# Patient Record
Sex: Male | Born: 1965
Health system: Southern US, Community
[De-identification: ages and names within clinical notes are randomized; demographics above are authoritative.]

## PROBLEM LIST (undated history)

## (undated) DIAGNOSIS — K649 Unspecified hemorrhoids: Secondary | ICD-10-CM

## (undated) DIAGNOSIS — G43909 Migraine, unspecified, not intractable, without status migrainosus: Secondary | ICD-10-CM

## (undated) DIAGNOSIS — K219 Gastro-esophageal reflux disease without esophagitis: Secondary | ICD-10-CM

## (undated) HISTORY — DX: Unspecified hemorrhoids: K64.9

## (undated) HISTORY — DX: Gastro-esophageal reflux disease without esophagitis: K21.9

## (undated) HISTORY — DX: Migraine, unspecified, not intractable, without status migrainosus: G43.909

---

## 1988-10-13 HISTORY — PX: INGUINAL HERNIA REPAIR: SUR1180

## 2013-10-20 ENCOUNTER — Ambulatory Visit: Payer: Self-pay | Admitting: Family Medicine

## 2013-10-31 ENCOUNTER — Ambulatory Visit (INDEPENDENT_AMBULATORY_CARE_PROVIDER_SITE_OTHER): Payer: 59 | Admitting: Family Medicine

## 2013-10-31 ENCOUNTER — Ambulatory Visit (INDEPENDENT_AMBULATORY_CARE_PROVIDER_SITE_OTHER): Payer: Private Health Insurance - Indemnity | Admitting: General Surgery

## 2013-10-31 ENCOUNTER — Encounter: Payer: Self-pay | Admitting: Family Medicine

## 2013-10-31 ENCOUNTER — Encounter (INDEPENDENT_AMBULATORY_CARE_PROVIDER_SITE_OTHER): Payer: Self-pay | Admitting: General Surgery

## 2013-10-31 VITALS — BP 110/78 | HR 80 | Temp 98.3°F | Ht 72.0 in | Wt 241.0 lb

## 2013-10-31 VITALS — BP 126/86 | HR 66 | Temp 97.4°F | Resp 16 | Ht 72.0 in | Wt 240.2 lb

## 2013-10-31 DIAGNOSIS — B356 Tinea cruris: Secondary | ICD-10-CM | POA: Insufficient documentation

## 2013-10-31 DIAGNOSIS — E669 Obesity, unspecified: Secondary | ICD-10-CM | POA: Insufficient documentation

## 2013-10-31 DIAGNOSIS — K649 Unspecified hemorrhoids: Secondary | ICD-10-CM

## 2013-10-31 DIAGNOSIS — K219 Gastro-esophageal reflux disease without esophagitis: Secondary | ICD-10-CM | POA: Insufficient documentation

## 2013-10-31 DIAGNOSIS — E663 Overweight: Secondary | ICD-10-CM | POA: Insufficient documentation

## 2013-10-31 DIAGNOSIS — G43909 Migraine, unspecified, not intractable, without status migrainosus: Secondary | ICD-10-CM | POA: Insufficient documentation

## 2013-10-31 LAB — COMPREHENSIVE METABOLIC PANEL
ALT: 26 U/L (ref 0–53)
AST: 13 U/L (ref 0–37)
Albumin: 4.2 g/dL (ref 3.5–5.2)
Alkaline Phosphatase: 50 U/L (ref 39–117)
BUN: 13 mg/dL (ref 6–23)
CO2: 27 mEq/L (ref 19–32)
Calcium: 9 mg/dL (ref 8.4–10.5)
Chloride: 104 mEq/L (ref 96–112)
Creatinine, Ser: 1.5 mg/dL (ref 0.4–1.5)
GFR: 54.36 mL/min — ABNORMAL LOW (ref >60.00)
Glucose, Bld: 90 mg/dL (ref 70–99)
Potassium: 4.2 mEq/L (ref 3.5–5.1)
Sodium: 138 mEq/L (ref 135–145)
Total Bilirubin: 0.9 mg/dL (ref 0.3–1.2)
Total Protein: 7.4 g/dL (ref 6.0–8.3)

## 2013-10-31 LAB — LIPID PANEL
Cholesterol: 190 mg/dL (ref 0–200)
HDL: 46.7 mg/dL (ref >39.00)
LDL Cholesterol: 125 mg/dL — ABNORMAL HIGH (ref 0–99)
Total CHOL/HDL Ratio: 4
Triglycerides: 92 mg/dL (ref 0.0–149.0)
VLDL: 18.4 mg/dL (ref 0.0–40.0)

## 2013-10-31 LAB — TSH: TSH: 3.52 u[IU]/mL (ref 0.35–5.50)

## 2013-10-31 MED ORDER — CYCLOBENZAPRINE HCL 10 MG PO TABS: 10.0000 mg | ORAL_TABLET | Freq: Two times a day (BID) | ORAL | Status: DC | PRN

## 2013-10-31 MED ORDER — HYDROCORTISONE ACETATE 25 MG RE SUPP
25.0000 mg | Freq: Two times a day (BID) | RECTAL | Status: DC | PRN
Start: 2013-10-31 — End: 2014-03-31

## 2013-10-31 NOTE — Assessment & Plan Note (Signed)
Chronic issue with hemorrhoids.  Evidence of both int/ext hemorrhoids on exam today at least grade II.   Causing some bowel leakage. Some inflammation on exam today - treat with rectacort suppositories. Will refer to surgery for futher evaluation/treatment.  Pt hesitant for surgery but agrees with plan for definitive management.

## 2013-10-31 NOTE — Progress Notes (Signed)
Subjective:    Patient ID: Eric Colon, male    DOB: Feb 02, 1966, 48 y.o.   MRN: [id number]  HPI CC: new pt to establish  Eric Colon presents today to establish care.  Fungal rash - self treated with lamisil and OTC creams.  Has seen dermatologist Dr. Koleen Nimrod for this - treated with oral lamisil which helped, but 1 mo after finishing treatment rash returns.  Has been using antifungal paste as well.  H/o hemorrhoids for 1-2 years - worse recently.  Increasing bleeding with stools.  Bleeding in stool and with wiping.  Clots at times as well.  Recently noticing bowel leaking as well.  No h/o colon evaluation in the past.  No weight loss noted.  1 stool a day, no frank constipation or diarrhea. Body mass index is 32.68 kg/(m^2).  GERD - prilosec has helped in the past.  Currently controlled with rolaids.  H/o migraines - last was 1 wk ago.  Tends to get every 2-3 weeks.  Normal headaches improve with caffeine.  Migraine - waits it out for several hours.  Typically unilateral severe pain.  + nausea, photo/phonophobia, activity interrupting headaches.  Notices lower extremity stiffness over last year.    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  Past histories reviewed and updated if relevant as below. There are no active problems to display for this patient.  Past Medical History  Diagnosis Date  . GERD (gastroesophageal reflux disease)   . Migraines   . Hemorrhoids    Past Surgical History  Procedure Laterality Date  . Inguinal hernia repair Left 1990   History  Substance Use Topics  . Smoking status: Never Smoker   . Smokeless tobacco: Never Used  . Alcohol Use: Yes     Comment: 2 beers/week   Family  History  Problem Relation Age of Onset  . Cancer Mother 42    lung (smoker)  . Cancer Father 1    prostate?  . CAD Neg Hx   . Stroke Neg Hx   . Diabetes Maternal Grandmother    No Known Allergies No current outpatient prescriptions on file prior to visit.   No current facility-administered medications on file prior to visit.     Review of Systems Per HPI    Objective:   Physical Exam  Nursing note and vitals reviewed. Constitutional: He is oriented to person, place, and time. He appears well-developed and well-nourished. No distress.  HENT:  Head: Normocephalic and atraumatic.  Nose: Nose normal.  Mouth/Throat: No oropharyngeal exudate.  Eyes: Conjunctivae and EOM are normal. Pupils are equal, round, and reactive to light. No scleral icterus.  Neck: Normal range of motion. Neck supple.  Cardiovascular: Normal rate, regular rhythm, normal heart sounds and intact distal pulses.   No murmur heard. Pulses:      Radial pulses are 2+ on the right side, and 2+ on the left side.  Pulmonary/Chest: Effort normal and breath sounds normal. No respiratory distress. He has no wheezes. He has no rales.  Abdominal: Soft. Bowel sounds are normal. He exhibits no distension and no mass. There is no tenderness. There is no rebound and no guarding.  Genitourinary: Rectal exam shows external hemorrhoid and internal hemorrhoid.  Several hemorrhoids int/ext present  Musculoskeletal:  Normal range of motion. He exhibits no edema.  Lymphadenopathy:    He has no cervical adenopathy.  Neurological: He is alert and oriented to person, place, and time.  CN grossly intact, station and gait intact  Skin: Skin is warm and dry. Rash noted.  Spreading pruritic slightly scaly annular erythematous rash on lower abd, groin and buttock.  Spares scrotum.  Psychiatric: He has a normal mood and affect. His behavior is normal. Judgment and thought content normal.       Assessment & Plan:

## 2013-10-31 NOTE — Patient Instructions (Addendum)
Pick up prescription at [facility]. Irregular bowel habits such as constipation and diarrhea can lead to many problems over time.  Having one soft bowel movement a day is the most important way to prevent further problems.  The anorectal canal is designed to handle stretching and feces to safely manage our ability to get rid of solid waste (feces, poop, stool) out of our body.  BUT, hard constipated stools can act like ripping concrete bricks and diarrhea can be a burning fire to this very sensitive area of our body, causing inflamed hemorrhoids, anal fissures, increasing risk is perirectal abscesses, abdominal pain/bloating, an making irritable bowel worse.     The goal: ONE SOFT BOWEL MOVEMENT A DAY!  To have soft, regular bowel movements:    Drink at least 8 tall glasses of water a day.     Take plenty of fiber.  Fiber is the undigested part of plant food that passes into the colon, acting s "natures broom" to encourage bowel motility and movement.  Fiber can absorb and hold large amounts of water. This results in a larger, bulkier stool, which is soft and easier to pass. Work gradually over several weeks up to 6 servings a day of fiber (25g a day even more if needed) in the form of: o Vegetables -- Root (potatoes, carrots, turnips), leafy green (lettuce, salad greens, celery, spinach), or cooked high residue (cabbage, broccoli, etc) o Fruit -- Fresh (unpeeled skin & pulp), Dried (prunes, apricots, cherries, etc ),  or stewed ( applesauce)  o Whole grain breads, pasta, etc (whole wheat)  o Bran cereals    Bulking Agents -- This type of water-retaining fiber generally is easily obtained each day by one of the following:  o Psyllium bran -- The psyllium plant is remarkable because its ground seeds can retain so much water. This product is available as Metamucil, Konsyl, Effersyllium, Per Diem Fiber, or the less expensive generic preparation in drug and health food stores.  Although labeled a laxative, it really is not a laxative.  o Methylcellulose -- This is another fiber derived from wood which also retains water. It is available as Citrucel. o Benefiber o Polyethylene Glycol - and "artificial" fiber commonly called Miralax or Glycolax.  It is helpful for people with gassy or bloated feelings with regular fiber o Flax Seed - a less gassy fiber than psyllium   No reading or other relaxing activity while on the toilet. If bowel movements take longer than 5 minutes, you are too constipated   AVOID CONSTIPATION.  High fiber and water intake usually takes care of this.  Sometimes a laxative is needed to stimulate more frequent bowel movements, but    Laxatives are not a good long-term solution as it can wear the colon out. o Osmotics (Milk of Magnesia, Fleets phosphosoda, Magnesium citrate, MiraLax, GoLytely) are safer than  o Stimulants (Senokot, Castor Oil, Dulcolax, Ex Lax)    o Do not take laxatives for more than 7days in a row.    IF SEVERELY CONSTIPATED, try a Bowel Retraining Program: o Do not use laxatives.  o Eat a diet high in roughage, such as bran cereals and leafy vegetables.  o Drink six (6) ounces of prune or apricot juice each morning.  o Eat two (2) large servings of stewed fruit each day.  o Take one (1) heaping tablespoon of a psyllium-based bulking agent twice a day. Use sugar-free sweetener when possible to avoid excessive calories.  o Eat a normal breakfast.  o Set aside 15 minutes after breakfast to sit on the toilet, but do not strain to have a bowel movement.  o If you do not have a bowel movement by the third day, use an enema and repeat the above steps.    Controlling diarrhea o Switch to liquids and simpler foods for a few days to avoid stressing your intestines further. o Avoid dairy products (especially milk & ice cream) for a short time.  The intestines often can lose the ability to digest lactose when stressed. o Avoid foods that  cause gassiness or bloating.  Typical foods include beans and other legumes, cabbage, broccoli, and dairy foods.  Every person has some sensitivity to other foods, so listen to our body and avoid those foods that trigger problems for you. o Adding fiber (Citrucel, Metamucil, psyllium, Miralax) gradually can help thicken stools by absorbing excess fluid and retrain the intestines to act more normally.  Slowly increase the dose over a few weeks.  Too much fiber too soon can backfire and cause cramping & bloating. o Probiotics (such as active yogurt, Align, etc) may help repopulate the intestines and colon with normal bacteria and calm down a sensitive digestive tract.  Most studies show it to be of mild help, though, and such products can be costly. o Medicines:   Bismuth subsalicylate (ex. Kayopectate, Pepto Bismol) every 30 minutes for up to 6 doses can help control diarrhea.  Avoid if pregnant.   Loperamide (Immodium) can slow down diarrhea.  Start with two tablets (4mg  total) first and then try one tablet every 6 hours.  Avoid if you are having fevers or severe pain.  If you are not better or start feeling worse, stop all medicines and call your doctor for advice o Call your doctor if you are getting worse or not better.  Sometimes further testing (cultures, endoscopy, X-ray studies, bloodwork, etc) may be needed to help diagnose and treat the cause of the diarrhea. o

## 2013-10-31 NOTE — Assessment & Plan Note (Signed)
Check LFTs, if normal, will treat with oral antifungal (griseofulvin tid 250mg  x14 days).   Will also recommend drying powder after treatment, avoiding tight fitting undergarments, and avoid hot baths. Check glu today to eval for diabetes in recurrent tinea infections. Will need to eval for athlete's foot next visit.

## 2013-10-31 NOTE — Progress Notes (Signed)
Patient ID: Eric Colon, male   DOB: [birth year], 48 y.o.   MRN: [id number]  Chief Complaint  Patient presents with  . Hemorrhoids    urgent- eval hems, bleeding    HPI Eric Colon is a 48 y.o. male.   HPI 48 year old Caucasian male referred by Dr. Danise Mina for evaluation of hemorrhoidal problems. The patient states that he's had hemorrhoidal problems for a long time. His main complaint over the past 8-9 months has been leakage after a bowel movement. He describes the drainage as clear and sometimes yellowish and it has been ongoing for 8-9 months. He reports having stool in his underwear on occasion. He states that he always has lots of gas from his bottom. He has a bowel movement at least every other day but mostly daily. He sits on the commode for 10-20 minutes at a time. He drinks 3-4 bottles of water a day. Sometimes it does hurt when he has a bowel movement. He also has been having bright red blood in the commode, and in his stool. He denies any weight loss. He denies any family history colorectal cancer. He denies any weight loss. He denies any pain with urination. He states that sometimes he feels like he doesn't completely empty his rectum. He has been suffering with a rash on his lower abdominal wall and around his bottom. It has cleared up with antifungal ointment in the past. Past Medical History  Diagnosis Date  . GERD (gastroesophageal reflux disease)   . Migraines   . Hemorrhoids     Past Surgical History  Procedure Laterality Date  . Inguinal hernia repair Right 1990    Family History  Problem Relation Age of Onset  . Cancer Mother 24    lung (smoker)  . Cancer Father 99    prostate?  . CAD Neg Hx   . Stroke Neg Hx   . Diabetes Maternal Grandmother     Social History History  Substance Use Topics  . Smoking status: Never Smoker   . Smokeless tobacco: Never Used  . Alcohol Use: Yes     Comment: 2 beers/week    No Known Allergies  Current Outpatient  Prescriptions  Medication Sig Dispense Refill  . cyclobenzaprine (FLEXERIL) 10 MG tablet Take 1 tablet (10 mg total) by mouth 2 (two) times daily as needed (migraines (sedation precautions)).  30 tablet  0  . hydrocortisone (RECTACORT-HC) 25 MG suppository Place 1 suppository (25 mg total) rectally 2 (two) times daily as needed for hemorrhoids or itching.  12 suppository  0   No current facility-administered medications for this visit.    Review of Systems Review of Systems  Constitutional: Negative for fever, chills, appetite change and unexpected weight change.  HENT: Negative for congestion and trouble swallowing.   Eyes: Negative for visual disturbance.  Respiratory: Negative for chest tightness and shortness of breath.   Cardiovascular: Negative for chest pain and leg swelling.       No PND, no orthopnea, no DOE  Gastrointestinal:       See HPI  Genitourinary: Negative for dysuria and hematuria.  Musculoskeletal: Negative.   Skin: Negative for rash.  Neurological: Negative for seizures and speech difficulty.  Hematological: Does not bruise/bleed easily.  Psychiatric/Behavioral: Negative for behavioral problems and confusion.    Blood pressure 126/86, pulse 66, temperature 97.4 F (36.3 C), temperature source Temporal, resp. rate 16, height 6' (1.829 m), weight 240 lb 3.2 oz (108.954 kg).  Physical Exam Physical Exam  Vitals reviewed. Constitutional: He is oriented to person, place, and time. He appears well-developed and well-nourished. No distress.  HENT:  Head: Normocephalic and atraumatic.  Right Ear: External ear normal.  Left Ear: External ear normal.  Eyes: Conjunctivae are normal. No scleral icterus.  Neck: Normal range of motion. Neck supple. No tracheal deviation present. No thyromegaly present.  Cardiovascular: Normal rate, normal heart sounds and intact distal pulses.   Pulmonary/Chest: Effort normal and breath sounds normal. No respiratory distress. He has no  wheezes.  Abdominal: Soft. He exhibits no distension. There is no tenderness. There is no rebound.    Genitourinary: Rectal exam shows internal hemorrhoid and anal tone abnormal. Rectal exam shows no fissure.     Scattered rash on l buttock; tone seems decreased; has redundant nonthrombosed circumferential hemorrhoidal tissue, +3 column large int hemorrhoids on anoscopy - mostly grade 2; left lateral may be grade 3 - which also has raw surface. No necrosis.   Musculoskeletal: Normal range of motion. He exhibits no edema and no tenderness.  Lymphadenopathy:    He has no cervical adenopathy.  Neurological: He is alert and oriented to person, place, and time. He exhibits normal muscle tone.  Skin: Skin is warm and dry. No rash noted. He is not diaphoretic. No erythema. No pallor.  Psychiatric: He has a normal mood and affect. His behavior is normal. Judgment and thought content normal.    Data Reviewed PCP note  Assessment    Circumferential non thrombosed external hemorrhoids 3 column internal hemorrhoids (left lateral - grade 2 vs 3)     Plan    We discussed the etiology of hemorrhoids. The patient was given educational material as well as diagrams. We discussed nonoperative and operative management of hemorrhoidal disease.  We discussed the importance of having a daily soft bowel movement and avoiding constipation. We also discussed good bowel habits such as not reading in the bathroom, not straining, and drinking 6-8 glasses of water per day. We also discussed the importance of a high fiber diet. We discussed foods that were high in fiber as well as fiber supplements. We discussed the importance of trying to get 25-30 g of fiber per day in their diet. We discussed the need to start with a low dose of fiber and then gradually increasing their daily fiber dose over several weeks in order to avoid bloating and cramping.   PLAN: His hemorrhoids are certainly impressive especially the  left lateral 1. However none of them require urgent surgical intervention. I explained that even with surgery he will still need to correct and improve his bowel habits. Therefore we are going to start with nonoperative management as described above. Because of his questionable decreased anal tone how have asked him to see our colorectal surgeon Dr. Marcello Moores for followup and exam. Even with correction of his underlying bowel habits I think he will ultimately need hemorrhoidectomy as I doubt the left lateral one will significantly improve even with nonoperative management. I am concerned about his questionable anal tone. He may also need an early screening colonoscopy. But given the irritation of the left lateral hemorrhoid it can certainly account for his bleeding. He will be made an appointment to see Dr. Marcello Moores in February.   Leighton Ruff. Redmond Pulling, MD, FACS General, Bariatric, & Minimally Invasive Surgery [facility], [place]          [facility] M 10/31/2013, 4:19 PM

## 2013-10-31 NOTE — Assessment & Plan Note (Signed)
Check FLP, CMP and TSH today. Discussed healthy diet changes and importance of regular exercise for sustainable weight loss.   Recommended against bariatric medication.

## 2013-10-31 NOTE — Progress Notes (Signed)
Pre-visit discussion using our clinic review tool. No additional management support is needed unless otherwise documented below in the visit note.  

## 2013-10-31 NOTE — Assessment & Plan Note (Signed)
1/2wks.  With treat abortively with flexeril prn.

## 2013-10-31 NOTE — Patient Instructions (Signed)
Use steroid suppositories for hemorrhoids.  I do recommend surgery referral for this - pass by Marion's office for this. For migraines - may use flexeril muscle relaxant at onset of headache. For rash - will await blood work then call you with oral antifungal.  This rash seems consistent with ringworm of groin - tinea cruris. Blood work today. Return in 1-2 months for follow up

## 2013-10-31 NOTE — Assessment & Plan Note (Signed)
Significant rolaid use - recommended back off this and may use OTC PPI - nexium OTC samples provided today.

## 2013-11-01 ENCOUNTER — Other Ambulatory Visit: Payer: Self-pay | Admitting: Family Medicine

## 2013-11-01 MED ORDER — GRISEOFULVIN ULTRAMICROSIZE 250 MG PO TABS: 250.0000 mg | ORAL_TABLET | Freq: Three times a day (TID) | ORAL | Status: DC

## 2013-11-02 ENCOUNTER — Telehealth: Payer: Self-pay

## 2013-11-02 MED ORDER — TERBINAFINE HCL 250 MG PO TABS: 250.0000 mg | ORAL_TABLET | Freq: Every day | ORAL | Status: DC

## 2013-11-02 NOTE — Telephone Encounter (Signed)
plz notify lamisil was sent in.

## 2013-11-02 NOTE — Telephone Encounter (Signed)
[facility] left v/m that griseofulvin was expensive and pt asked pharmacy to cb and request Lamisil; pt thought Lamisil was being sent to pharmacy.Please advise.

## 2013-11-02 NOTE — Telephone Encounter (Signed)
Patient notified

## 2013-11-24 ENCOUNTER — Encounter (INDEPENDENT_AMBULATORY_CARE_PROVIDER_SITE_OTHER): Payer: Private Health Insurance - Indemnity | Admitting: General Surgery

## 2013-11-29 ENCOUNTER — Encounter (INDEPENDENT_AMBULATORY_CARE_PROVIDER_SITE_OTHER): Payer: Private Health Insurance - Indemnity | Admitting: General Surgery

## 2014-01-06 ENCOUNTER — Ambulatory Visit (INDEPENDENT_AMBULATORY_CARE_PROVIDER_SITE_OTHER): Payer: 59 | Admitting: Family Medicine

## 2014-01-06 ENCOUNTER — Encounter: Payer: Self-pay | Admitting: Family Medicine

## 2014-01-06 VITALS — BP 110/80 | HR 68 | Temp 98.0°F | Wt 235.0 lb

## 2014-01-06 DIAGNOSIS — R5381 Other malaise: Secondary | ICD-10-CM

## 2014-01-06 DIAGNOSIS — N289 Disorder of kidney and ureter, unspecified: Secondary | ICD-10-CM

## 2014-01-06 DIAGNOSIS — J309 Allergic rhinitis, unspecified: Secondary | ICD-10-CM

## 2014-01-06 DIAGNOSIS — R5383 Other fatigue: Secondary | ICD-10-CM

## 2014-01-06 DIAGNOSIS — K649 Unspecified hemorrhoids: Secondary | ICD-10-CM

## 2014-01-06 DIAGNOSIS — G43909 Migraine, unspecified, not intractable, without status migrainosus: Secondary | ICD-10-CM

## 2014-01-06 DIAGNOSIS — J302 Other seasonal allergic rhinitis: Secondary | ICD-10-CM

## 2014-01-06 DIAGNOSIS — B356 Tinea cruris: Secondary | ICD-10-CM

## 2014-01-06 MED ORDER — TERBINAFINE HCL 250 MG PO TABS: 250.0000 mg | ORAL_TABLET | Freq: Every day | ORAL | Status: DC

## 2014-01-06 MED ORDER — CLOTRIMAZOLE 1 % EX CREA: 1.0000 "application " | TOPICAL_CREAM | Freq: Two times a day (BID) | CUTANEOUS | Status: DC

## 2014-01-06 MED ORDER — GRISEOFULVIN MICROSIZE 500 MG PO TABS: 500.0000 mg | ORAL_TABLET | Freq: Every day | ORAL | Status: DC

## 2014-01-06 NOTE — Progress Notes (Signed)
BP 110/80  Pulse 68  Temp(Src) 98 F (36.7 C) (Oral)  Wt 235 lb (106.595 kg)   CC: 2 mo f/u  Subjective:    Patient ID: Eric Colon, male    DOB: 1965/11/16, 48 y.o.   MRN: [id number]  HPI: Eric Colon is a 48 y.o. male presenting on 01/06/2014 for Follow-up   See prior note for details.  Migraines - flexeril abortively working well.  Overall stable.  Notices that increased water intake has led to decreased migraines.  CKD - Cr 1.5.  Has made conscious effort to increase water intake.  Hemorrhoids - seen by Dr. Redmond Pulling, rec see Dr. Marcello Moores colorectal surgeon. Did not f/u with him yet (cancellations due to snow).  rec increase in fiber.  Tinea cruris - griseofulvin 2 wk course after normal LFTs.  Rash returns once he stops lamisil.  Has not been regular with [employer].  Also noticed rash returned after prolonged trip to pool.  Increasing fatigue recently - over last few weeks since he decreased energy drink intake.    Seasonal allergic rhinitis - treating with claritin D OTC.  Red watery eyes especially at work.    Wt Readings from Last 3 Encounters:  01/06/14 235 lb (106.595 kg)  10/31/13 240 lb 3.2 oz (108.954 kg)  10/31/13 241 lb (109.317 kg)    Relevant past medical, surgical, family and social history reviewed and updated as indicated.  Allergies and medications reviewed and updated. Current Outpatient Prescriptions on File Prior to Visit  Medication Sig  . cyclobenzaprine (FLEXERIL) 10 MG tablet Take 1 tablet (10 mg total) by mouth 2 (two) times daily as needed (migraines (sedation precautions)).  . hydrocortisone (RECTACORT-HC) 25 MG suppository Place 1 suppository (25 mg total) rectally 2 (two) times daily as needed for hemorrhoids or itching.   No current facility-administered medications on file prior to visit.    Review of Systems Per HPI unless specifically indicated above    Objective:    BP 110/80  Pulse 68  Temp(Src) 98 F (36.7 C) (Oral)  Wt  235 lb (106.595 kg)  Physical Exam  Nursing note and vitals reviewed. Constitutional: He appears well-developed and well-nourished. No distress.  HENT:  Head: Normocephalic and atraumatic.  Right Ear: Hearing, tympanic membrane, external ear and ear canal normal.  Left Ear: Hearing, tympanic membrane, external ear and ear canal normal.  Nose: Mucosal edema and rhinorrhea present. Right sinus exhibits no maxillary sinus tenderness and no frontal sinus tenderness. Left sinus exhibits no maxillary sinus tenderness and no frontal sinus tenderness.  Mouth/Throat: Uvula is midline, oropharynx is clear and moist and mucous membranes are normal. No oropharyngeal exudate, posterior oropharyngeal edema, posterior oropharyngeal erythema or tonsillar abscesses.  Boggy turbinates  Eyes: EOM are normal. Pupils are equal, round, and reactive to light. Right conjunctiva is injected. Left conjunctiva is injected. No scleral icterus.  Neck: Normal range of motion. Neck supple.  Cardiovascular: Normal rate, regular rhythm, normal heart sounds and intact distal pulses.   No murmur heard. Pulmonary/Chest: Effort normal and breath sounds normal. No respiratory distress. He has no wheezes. He has no rales.  Musculoskeletal: He exhibits no edema.  R foot clear L foot clear with great toenail with onychomycosis  Lymphadenopathy:    He has no cervical adenopathy.  Skin: Rash noted.  Persistent pruritic papular rash right lower abdomen and groin with central clearing   Results for orders placed in visit on 01/06/14  HEPATIC FUNCTION PANEL  Result Value Ref Range   Total Bilirubin 0.5  0.2 - 1.2 mg/dL   Bilirubin, Direct 0.1  0.0 - 0.3 mg/dL   Indirect Bilirubin 0.4  0.2 - 1.2 mg/dL   Alkaline Phosphatase 57  39 - 117 U/L   AST 14  0 - 37 U/L   ALT 24  0 - 53 U/L   Total Protein 6.6  6.0 - 8.3 g/dL   Albumin 4.3  3.5 - 5.2 g/dL  VITAMIN B12      Result Value Ref Range   Vitamin B-12 529  211 - 911 pg/mL   BASIC METABOLIC PANEL      Result Value Ref Range   Sodium 137  135 - 145 mEq/L   Potassium 4.3  3.5 - 5.3 mEq/L   Chloride 101  96 - 112 mEq/L   CO2 24  19 - 32 mEq/L   Glucose, Bld 78  70 - 99 mg/dL   BUN 20  6 - 23 mg/dL   Creat 1.36 (*) 0.50 - 1.35 mg/dL   Calcium 9.3  8.4 - 10.5 mg/dL      Assessment & Plan:   Problem List Items Addressed This Visit   Fatigue     Anticipate due to recently significantly decreased use of energy drinks. Will check B12 per pt request as he's interested in B12 shots.    Relevant Orders      Hepatic function panel (Completed)      Vitamin B12 (Completed)      Basic metabolic panel (Completed)   Hemorrhoids     Significant external hemorrhoids along with decreased rectal tone.  Planning on rescheduling f/u with colorectal surgeon for further evaluation.  Increase in fiber has helped stool consistency but increased cramping and gas - discussed retrial of soluble fiber supplement.    Migraines     Improved with flexeril prn and increased water intake, decreased energy drinks.  Continue.    Renal insufficiency     New dx - rpt Cr today.  Pt endorses increased water intake.    Relevant Orders      Hepatic function panel (Completed)      Vitamin B12 (Completed)      Basic metabolic panel (Completed)   Seasonal allergic rhinitis     Continue claritin D.  Consider antihistamine allergy drops.    Tinea cruris - Primary     While on lamisil, rash disappears but quickly returns once he discontinues med.  Aware planned pool/beach trips will likely exacerbate condition. Will do another course of treatment - discussed use of oral antifungal and use of topical antifungal (clotrimazole sent in) and then use of drying powder regularly. Prior could not afford griseofulvin so we used lamisil.  Provided scripts for both for pt to price out - has always been on lamisil, never trial of other antifungal.  Aware griseofulvin may provide better effect. Will  recheck LFTs in anticipation of another oral antifungal course. No evidence of athlete's foot.    Relevant Medications      terbinafine (LAMISIL) 250 MG tablet      clotrimazole (LOTRIMIN) 1 % cream      griseofulvin (GRIFULVIN V) 500 MG tablet   Other Relevant Orders      Hepatic function panel (Completed)      Vitamin B12 (Completed)      Basic metabolic panel (Completed)       Follow up plan: Return as needed, for follow up.

## 2014-01-06 NOTE — Patient Instructions (Signed)
I've sent in lamisil and printed out prescription for griseofulvin 500mg  daily.  Price both out then take one for another month. Restart drying powder and may use topical antifungal (Sent to pharmacy). Liver function checked today. Look into more soluble fiber supplements.  Jock Itch Jock itch is a fungal infection of the skin in the groin area. It is sometimes called "ringworm" even though it is not caused by a worm. A fungus is a type of germ that thrives in dark, damp places.  CAUSES  This infection may spread from:  A fungus infection elsewhere on the body (such as athlete's foot).  Sharing towels or clothing. This infection is more common in:  Hot, humid climates.  People who wear tight-fitting clothing or wet bathing suits for long periods of time.  Athletes.  Overweight people.  People with diabetes. SYMPTOMS  Jock itch causes the following symptoms:  Red, pink or brown rash in the groin. Rash may spread to the thighs, anus, and buttocks.  Itching. DIAGNOSIS  Your caregiver may make the diagnosis by looking at the rash. Sometimes a skin scraping will be sent to test for fungus. Testing can be done either by looking under the microscope or by doing a culture (test to try to grow the fungus). A culture can take up to 2 weeks to come back. TREATMENT  Jock itch may be treated with:  Skin cream or ointment to kill fungus.  Medicine by mouth to kill fungus.  Skin cream or ointment to calm the itching.  Compresses or medicated powders to dry the infected skin. HOME CARE INSTRUCTIONS   Be sure to treat the rash completely. Follow your caregiver's instructions. It can take a couple of weeks to treat. If you do not treat the infection long enough, the rash can come back.  Wear loose-fitting clothing.  Men should wear cotton boxer shorts.  Women should wear cotton underwear.  Avoid hot baths.  Dry the groin area well after bathing. SEEK MEDICAL CARE IF:   Your  rash is worse.  Your rash is spreading.  Your rash returns after treatment is finished.  Your rash is not gone in 4 weeks. Fungal infections are slow to respond to treatment. Some redness may remain for several weeks after the fungus is gone. SEEK IMMEDIATE MEDICAL CARE IF:  The area becomes red, warm, tender, and swollen.  You have a fever. Document Released: 09/19/2002 Document Revised: 12/22/2011 Document Reviewed: 08/18/2008 [facility] Patient Information 2014 [place], [place].

## 2014-01-06 NOTE — Progress Notes (Signed)
Pre visit review using our clinic review tool, if applicable. No additional management support is needed unless otherwise documented below in the visit note. 

## 2014-01-07 DIAGNOSIS — J302 Other seasonal allergic rhinitis: Secondary | ICD-10-CM | POA: Insufficient documentation

## 2014-01-07 DIAGNOSIS — N289 Disorder of kidney and ureter, unspecified: Secondary | ICD-10-CM | POA: Insufficient documentation

## 2014-01-07 DIAGNOSIS — R5383 Other fatigue: Secondary | ICD-10-CM | POA: Insufficient documentation

## 2014-01-07 LAB — BASIC METABOLIC PANEL
BUN: 20 mg/dL (ref 6–23)
CO2: 24 mEq/L (ref 19–32)
Calcium: 9.3 mg/dL (ref 8.4–10.5)
Chloride: 101 mEq/L (ref 96–112)
Creat: 1.36 mg/dL — ABNORMAL HIGH (ref 0.50–1.35)
Glucose, Bld: 78 mg/dL (ref 70–99)
Potassium: 4.3 mEq/L (ref 3.5–5.3)
Sodium: 137 mEq/L (ref 135–145)

## 2014-01-07 LAB — HEPATIC FUNCTION PANEL
ALT: 24 U/L (ref 0–53)
AST: 14 U/L (ref 0–37)
Albumin: 4.3 g/dL (ref 3.5–5.2)
Alkaline Phosphatase: 57 U/L (ref 39–117)
Bilirubin, Direct: 0.1 mg/dL (ref 0.0–0.3)
Indirect Bilirubin: 0.4 mg/dL (ref 0.2–1.2)
Total Bilirubin: 0.5 mg/dL (ref 0.2–1.2)
Total Protein: 6.6 g/dL (ref 6.0–8.3)

## 2014-01-07 LAB — VITAMIN B12: Vitamin B-12: 529 pg/mL (ref 211–911)

## 2014-01-07 NOTE — Assessment & Plan Note (Signed)
Continue claritin D.  Consider antihistamine allergy drops.

## 2014-01-07 NOTE — Assessment & Plan Note (Signed)
Significant external hemorrhoids along with decreased rectal tone.  Planning on rescheduling f/u with colorectal surgeon for further evaluation.  Increase in fiber has helped stool consistency but increased cramping and gas - discussed retrial of soluble fiber supplement.

## 2014-01-07 NOTE — Assessment & Plan Note (Signed)
While on lamisil, rash disappears but quickly returns once he discontinues med.  Aware planned pool/beach trips will likely exacerbate condition. Will do another course of treatment - discussed use of oral antifungal and use of topical antifungal (clotrimazole sent in) and then use of drying powder regularly. Prior could not afford griseofulvin so we used lamisil.  Provided scripts for both for pt to price out - has always been on lamisil, never trial of other antifungal.  Aware griseofulvin may provide better effect. Will recheck LFTs in anticipation of another oral antifungal course. No evidence of athlete's foot.

## 2014-01-07 NOTE — Assessment & Plan Note (Signed)
Improved with flexeril prn and increased water intake, decreased energy drinks.  Continue.

## 2014-01-07 NOTE — Assessment & Plan Note (Signed)
New dx - rpt Cr today.  Pt endorses increased water intake.

## 2014-01-07 NOTE — Assessment & Plan Note (Signed)
Anticipate due to recently significantly decreased use of energy drinks. Will check B12 per pt request as he's interested in B12 shots.

## 2014-03-31 ENCOUNTER — Encounter: Payer: Self-pay | Admitting: Family Medicine

## 2014-03-31 ENCOUNTER — Ambulatory Visit (INDEPENDENT_AMBULATORY_CARE_PROVIDER_SITE_OTHER): Payer: 59 | Admitting: Family Medicine

## 2014-03-31 VITALS — BP 128/88 | HR 96 | Temp 98.2°F | Wt 225.5 lb

## 2014-03-31 DIAGNOSIS — B356 Tinea cruris: Secondary | ICD-10-CM

## 2014-03-31 MED ORDER — TERBINAFINE HCL 250 MG PO TABS: 250.0000 mg | ORAL_TABLET | Freq: Every day | ORAL | Status: DC

## 2014-03-31 NOTE — Patient Instructions (Signed)
Call Dr. Darnell Level next week and give him an update.  I'll talk to him about potentially staggered dosing and the potential need for follow up liver testing.  Take care.

## 2014-03-31 NOTE — Progress Notes (Signed)
Pre visit review using our clinic review tool, if applicable. No additional management support is needed unless otherwise documented below in the visit note.  H/o tinea cruris.  He had a course of lamisil prev and improved.  Was doing well until recently.  B groin itchy rash. D/w pt about possible options.    Meds, vitals, and allergies reviewed.   ROS: See HPI.  Otherwise, noncontributory.  nad B tinea infection in the groin, on the lower abd and the buttocks.

## 2014-04-02 NOTE — Assessment & Plan Note (Signed)
Start 10 day course of lamisil.  Will defer to PCP about pulse dosing, etc.  I didn't check LFTs at this point as this is a short course.  He agrees.  Will ask for PCP input.

## 2014-11-30 ENCOUNTER — Other Ambulatory Visit: Payer: Self-pay | Admitting: Family Medicine

## 2014-11-30 NOTE — Telephone Encounter (Signed)
Ok to refill 

## 2015-02-26 ENCOUNTER — Other Ambulatory Visit: Payer: Self-pay

## 2015-02-26 ENCOUNTER — Telehealth: Payer: Self-pay | Admitting: Emergency Medicine

## 2015-02-26 ENCOUNTER — Emergency Department
Admission: EM | Admit: 2015-02-26 | Discharge: 2015-02-26 | Disposition: A | Discharge status: Home / Self Care | Payer: Managed Care, Other (non HMO) | Attending: Emergency Medicine | Admitting: Emergency Medicine

## 2015-02-26 ENCOUNTER — Encounter: Payer: Self-pay | Admitting: Emergency Medicine

## 2015-02-26 ENCOUNTER — Telehealth: Payer: Self-pay | Admitting: Family Medicine

## 2015-02-26 ENCOUNTER — Emergency Department: Payer: Managed Care, Other (non HMO)

## 2015-02-26 DIAGNOSIS — F419 Anxiety disorder, unspecified: Secondary | ICD-10-CM | POA: Insufficient documentation

## 2015-02-26 DIAGNOSIS — R0602 Shortness of breath: Secondary | ICD-10-CM | POA: Diagnosis present

## 2015-02-26 DIAGNOSIS — Z79899 Other long term (current) drug therapy: Secondary | ICD-10-CM | POA: Insufficient documentation

## 2015-02-26 DIAGNOSIS — K219 Gastro-esophageal reflux disease without esophagitis: Secondary | ICD-10-CM | POA: Insufficient documentation

## 2015-02-26 LAB — CBC
HCT: 47.7 % (ref 40.0–52.0)
Hemoglobin: 16.2 g/dL (ref 13.0–18.0)
MCH: 29.2 pg (ref 26.0–34.0)
MCHC: 33.9 g/dL (ref 32.0–36.0)
MCV: 86.2 fL (ref 80.0–100.0)
Platelets: 163 10*3/uL (ref 150–440)
RBC: 5.53 MIL/uL (ref 4.40–5.90)
RDW: 13.1 % (ref 11.5–14.5)
WBC: 3.7 10*3/uL — ABNORMAL LOW (ref 3.8–10.6)

## 2015-02-26 LAB — BASIC METABOLIC PANEL
Anion gap: 6 (ref 5–15)
BUN: 23 mg/dL — ABNORMAL HIGH (ref 6–20)
CO2: 27 mmol/L (ref 22–32)
Calcium: 9.1 mg/dL (ref 8.9–10.3)
Chloride: 105 mmol/L (ref 101–111)
Creatinine, Ser: 1.37 mg/dL — ABNORMAL HIGH (ref 0.61–1.24)
GFR calc Af Amer: >60 mL/min (ref >60)
GFR calc non Af Amer: >60 mL/min (ref >60)
Glucose, Bld: 108 mg/dL — ABNORMAL HIGH (ref 65–99)
Potassium: 4.8 mmol/L (ref 3.5–5.1)
Sodium: 138 mmol/L (ref 135–145)

## 2015-02-26 LAB — TROPONIN I: Troponin I: <0.03 ng/mL (ref <0.031)

## 2015-02-26 MED ORDER — SUCRALFATE 1 G PO TABS: 1.0000 g | ORAL_TABLET | Freq: Four times a day (QID) | ORAL | Status: DC

## 2015-02-26 MED ORDER — RANITIDINE HCL 150 MG PO CAPS: 150.0000 mg | ORAL_CAPSULE | Freq: Two times a day (BID) | ORAL | Status: DC

## 2015-02-26 NOTE — ED Notes (Signed)
Pt left before discharge instructions, MD aware

## 2015-02-26 NOTE — Telephone Encounter (Signed)
[facility]  Patient Name: Eric Colon  DOB: 05/19/1966    Initial Comment Caller states, starting Thurs night, he has cold sweats, shortness of breath, and pain on lower left side    Nurse Assessment  Nurse: Mechele Dawley, RN, Amy Date/Time (Eastern Time): 02/26/2015 9:42:15 AM  Confirm and document reason for call. If symptomatic, describe symptoms. ---CALLER STATES THAT HE HAS BEEN HAVING PAIN ON HIS LEFT LOWER SIDE. HE IS HAVING SORENESS IN ALL OF HIS JOINTS. HE IS HAVING SORENESS ON THE LEFT SIDE OF HIS BODY. HE STATES THAT HE FEELS AS IF HE HAS HE FLU AS IN SOB, BODYACHES. HE STATES THAT HE IS HAVING COLD SWEATS, SOB AND NO ENERGY. HE WAS HAVING CHEST PAIN ON SAT MORNING. HE STATES THAT IT LASTED 30 MINUTES INTERMITTENTLY. HE STATES THAT IT WAS AS IF HE PULLED A MUSCLE IN HIS CHEST.  Has the patient traveled out of the country within the last 30 days? ---Not Applicable  Does the patient require triage? ---Yes  Related visit to physician within the last 2 weeks? ---No  Does the PT have any chronic conditions? (i.e. diabetes, asthma, etc.) ---No     Guidelines    Guideline Title Affirmed Question Affirmed Notes  Chest Pain [1] Intermittent chest pain or "angina" AND [2] increasing in severity or frequency (Exception: pains lasting a few seconds)    Final Disposition User   Go to ED Now [place], Therapist, sports, Amy

## 2015-02-26 NOTE — ED Provider Notes (Signed)
[facility] Emergency Department Provider Note  ____________________________________________  Time seen: 12:20 PM  I have reviewed the triage vital signs and the nursing notes.   HISTORY  Chief Complaint Shortness of Breath    HPI Eric Colon is a 49 y.o. male report shortness of breath mainly at night. It's worse lying supine. He does report a history of GERD but has been off his Nexium for several months now. He denies any chest pain, dizziness or syncope. No nausea, vomiting. She he also has been having some night sweats. Has had recent travel in the last year or 22, both as well as [place]. Has a dry cough but nothing productive. No hemoptysis. No known exposure to people with tuberculosis. No known immunosuppression. No fever     Past Medical History  Diagnosis Date  . GERD (gastroesophageal reflux disease)   . Migraines   . Hemorrhoids     Patient Active Problem List   Diagnosis Date Noted  . Renal insufficiency 01/07/2014  . Fatigue 01/07/2014  . Seasonal allergic rhinitis 01/07/2014  . Obesity 10/31/2013  . Tinea cruris 10/31/2013  . GERD (gastroesophageal reflux disease)   . Migraines   . Hemorrhoids     Past Surgical History  Procedure Laterality Date  . Inguinal hernia repair Right 1990    Current Outpatient Rx  Name  Route  Sig  Dispense  Refill  . clotrimazole (LOTRIMIN) 1 % cream   Topical   Apply 1 application topically 2 (two) times daily.   60 g   0   . cyclobenzaprine (FLEXERIL) 10 MG tablet   Oral   Take 1 tablet (10 mg total) by mouth 2 (two) times daily as needed (migraines (sedation precautions)).   30 tablet   0   . ranitidine (ZANTAC) 150 MG capsule   Oral   Take 1 capsule (150 mg total) by mouth 2 (two) times daily.   28 capsule   0   . sucralfate (CARAFATE) 1 G tablet   Oral   Take 1 tablet (1 g total) by mouth 4 (four) times daily.   120 tablet   1   . terbinafine (LAMISIL) 250 MG tablet     TAKE ONE TABLET BY MOUTH ONCE DAILY   10 tablet   0     May repeat x1, but if needed again will need OV an ...     Allergies Review of patient's allergies indicates no known allergies.  Family History  Problem Relation Age of Onset  . Cancer Mother 17    lung (smoker)  . Cancer Father 90    prostate?  . CAD Neg Hx   . Stroke Neg Hx   . Diabetes Maternal Grandmother     Social History History  Substance Use Topics  . Smoking status: Never Smoker   . Smokeless tobacco: Never Used  . Alcohol Use: Yes     Comment: 2 beers/week    Review of Systems  Constitutional: Night sweats and chills. No weight changes Eyes:No blurry vision or double vision.  ENT: No sore throat. Cardiovascular: No chest pain. Respiratory: Nonproductive cough, dyspnea. Gastrointestinal: Negative for abdominal pain, vomiting and diarrhea.  No BRBPR or melena. Genitourinary: Negative for dysuria, urinary retention, bloody urine, or difficulty urinating. Musculoskeletal: Negative for back pain. No joint swelling or pain. Skin: Negative for rash. Neurological: Negative for headaches, focal weakness or numbness. Psychiatric: Anxiety. No SI or HI or hallucinations  Endocrine:No hot/cold intolerance, changes in energy,  or sleep difficulty.  10-point ROS otherwise negative.  ____________________________________________   PHYSICAL EXAM:  VITAL SIGNS: ED Triage Vitals  Enc Vitals Group     BP 02/26/15 1006 121/96 mmHg     Pulse Rate 02/26/15 1006 99     Resp 02/26/15 1006 20     Temp 02/26/15 1006 97.8 F (36.6 C)     Temp Source 02/26/15 1006 Oral     SpO2 02/26/15 1006 99 %     Weight 02/26/15 1006 234 lb (106.142 kg)     Height 02/26/15 1006 6' (1.829 m)     Head Cir --      Peak Flow --      Pain Score 02/26/15 1007 2     Pain Loc --      Pain Edu? --      Excl. in [place]? --      Constitutional: Alert and oriented. Well appearing and in no distress. Eyes: No scleral icterus. No  conjunctival pallor. PERRL. EOMI ENT   Head: Normocephalic and atraumatic.   Nose: No congestion/rhinnorhea. No septal hematoma   Mouth/Throat: MMM, positive pharyngeal erythema. No peritonsillar mass. No uvula shift.   Neck: No stridor. No SubQ emphysema. No meningismus. Hematological/Lymphatic/Immunilogical: No cervical lymphadenopathy. Cardiovascular: RRR. Normal and symmetric distal pulses are present in all extremities. No murmurs, rubs, or gallops. Respiratory: Normal respiratory effort without tachypnea nor retractions. Breath sounds are clear and equal bilaterally. No wheezes/rales/rhonchi. Gastrointestinal: Soft and nontender. No distention. There is no CVA tenderness.  No rebound, rigidity, or guarding. Genitourinary: deferred Musculoskeletal: Nontender with normal range of motion in all extremities. No joint effusions.  No lower extremity tenderness.  No edema. Neurologic:   Normal speech and language.  CN 2-10 normal. Motor grossly intact. No pronator drift.  Normal gait. No gross focal neurologic deficits are appreciated.  Skin:  Skin is warm, dry and intact. No rash noted.  No petechiae, purpura, or bullae. Psychiatric: Mood and affect are normal. Speech and behavior are normal. Patient exhibits appropriate insight and judgment. Appears slightly anxious  ____________________________________________    LABS (pertinent positives/negatives) (all labs ordered are listed, but only abnormal results are displayed) Labs Reviewed  CBC - Abnormal; Notable for the following:    WBC 3.7 (*)    All other components within normal limits  BASIC METABOLIC PANEL - Abnormal; Notable for the following:    Glucose, Bld 108 (*)    BUN 23 (*)    Creatinine, Ser 1.37 (*)    All other components within normal limits  TROPONIN I   ____________________________________________   EKG  Normal sinus rhythm, rate of 87. Normal axis, normal intervals. poor R-wave progression in  anterior leads. This is with T-wave inversion in lead 3. No evidence of right heart strain or an S1 Q3 T3 pattern  ____________________________________________    RADIOLOGY  Chest x-ray unremarkable  ____________________________________________   PROCEDURES  ____________________________________________   INITIAL IMPRESSION / ASSESSMENT AND PLAN / ED COURSE  Pertinent labs & imaging results that were available during my care of the patient were reviewed by me and considered in my medical decision making (see chart for details).  Patient is in no acute distress, nontoxic, well-appearing. We'll check a chest x-ray to evaluate for TB, although I think the suspicion is low. He can follow up with his primary care doctor for PPD testing, most likely symptoms related to his GERD which she has stopped treating with Nexium. He does report that it is  better when he takes Tums. If workup is unremarkable, we'll discharge her home with prescriptions for Zantac and Carafate. Follow up with his doctor for further management  ----------------------------------------- 1:43 PM on 02/26/2015 ----------------------------------------- Workup unremarkable. Patient stated he did not feel like waiting for results anymore. Patient eloped from the ED before discharge paperwork could be given to him, but I did have an extensive discussion with him on initial evaluation about the likely diagnosis of GERD and the need to manage this with resuming his Nexium or taking ranitidine and Carafate. I did encourage him to follow up with his primary care doctor for continued treatment. Patient is not in distress, has no apparent emergency condition at this time such as ACS, TAD, PE, pneumothorax, carditis, or sepsis, and has medical decision-making capacity.     ____________________________________________   FINAL CLINICAL IMPRESSION(S) / ED DIAGNOSES  Final diagnoses:  Gastroesophageal reflux disease, esophagitis  presence not specified  Anxiety      Carrie Mew, MD 02/26/15 1344

## 2015-02-26 NOTE — Telephone Encounter (Signed)
plz call for update - looks like he was seen at [facility] ER.

## 2015-02-26 NOTE — Discharge Instructions (Signed)
Gastroesophageal Reflux Disease, Adult  Gastroesophageal reflux disease (GERD) happens when acid from your stomach flows up into the esophagus. When acid comes in contact with the esophagus, the acid causes soreness (inflammation) in the esophagus. Over time, GERD may create small holes (ulcers) in the lining of the esophagus.  CAUSES   · Increased body weight. This puts pressure on the stomach, making acid rise from the stomach into the esophagus.  · Smoking. This increases acid production in the stomach.  · Drinking alcohol. This causes decreased pressure in the lower esophageal sphincter (valve or ring of muscle between the esophagus and stomach), allowing acid from the stomach into the esophagus.  · Late evening meals and a full stomach. This increases pressure and acid production in the stomach.  · A malformed lower esophageal sphincter.  Sometimes, no cause is found.  SYMPTOMS   · Burning pain in the lower part of the mid-chest behind the breastbone and in the mid-stomach area. This may occur twice a week or more often.  · Trouble swallowing.  · Sore throat.  · Dry cough.  · Asthma-like symptoms including chest tightness, shortness of breath, or wheezing.  DIAGNOSIS   Your caregiver may be able to diagnose GERD based on your symptoms. In some cases, X-rays and other tests may be done to check for complications or to check the condition of your stomach and esophagus.  TREATMENT   Your caregiver may recommend over-the-counter or prescription medicines to help decrease acid production. Ask your caregiver before starting or adding any new medicines.   HOME CARE INSTRUCTIONS   · Change the factors that you can control. Ask your caregiver for guidance concerning weight loss, quitting smoking, and alcohol consumption.  · Avoid foods and drinks that make your symptoms worse, such as:  ¨ Caffeine or alcoholic drinks.  ¨ Chocolate.  ¨ Peppermint or mint flavorings.  ¨ Garlic and onions.  ¨ Spicy foods.  ¨ Citrus fruits,  such as oranges, lemons, or limes.  ¨ Tomato-based foods such as sauce, chili, salsa, and pizza.  ¨ Fried and fatty foods.  · Avoid lying down for the 3 hours prior to your bedtime or prior to taking a nap.  · Eat small, frequent meals instead of large meals.  · Wear loose-fitting clothing. Do not wear anything tight around your waist that causes pressure on your stomach.  · Raise the head of your bed 6 to 8 inches with wood blocks to help you sleep. Extra pillows will not help.  · Only take over-the-counter or prescription medicines for pain, discomfort, or fever as directed by your caregiver.  · Do not take aspirin, ibuprofen, or other nonsteroidal anti-inflammatory drugs (NSAIDs).  SEEK IMMEDIATE MEDICAL CARE IF:   · You have pain in your arms, neck, jaw, teeth, or back.  · Your pain increases or changes in intensity or duration.  · You develop nausea, vomiting, or sweating (diaphoresis).  · You develop shortness of breath, or you faint.  · Your vomit is green, yellow, black, or looks like coffee grounds or blood.  · Your stool is red, bloody, or black.  These symptoms could be signs of other problems, such as heart disease, gastric bleeding, or esophageal bleeding.  MAKE SURE YOU:   · Understand these instructions.  · Will watch your condition.  · Will get help right away if you are not doing well or get worse.  Document Released: 07/09/2005 Document Revised: 12/22/2011 Document Reviewed: 04/18/2011  ExitCare® Patient   Information ©2015 [employer]. This information is not intended to replace advice given to you by your health care provider. Make sure you discuss any questions you have with your health care provider.  Food Choices for Gastroesophageal Reflux Disease  When you have gastroesophageal reflux disease (GERD), the foods you eat and your eating habits are very important. Choosing the right foods can help ease the discomfort of GERD.  WHAT GENERAL GUIDELINES DO I NEED TO FOLLOW?  · Choose fruits,  vegetables, whole grains, low-fat dairy products, and low-fat meat, fish, and poultry.  · Limit fats such as oils, salad dressings, butter, nuts, and avocado.  · Keep a food diary to identify foods that cause symptoms.  · Avoid foods that cause reflux. These may be different for different people.  · Eat frequent small meals instead of three large meals each day.  · Eat your meals slowly, in a relaxed setting.  · Limit fried foods.  · Cook foods using methods other than frying.  · Avoid drinking alcohol.  · Avoid drinking large amounts of liquids with your meals.  · Avoid bending over or lying down until 2-3 hours after eating.  WHAT FOODS ARE NOT RECOMMENDED?  The following are some foods and drinks that may worsen your symptoms:  Vegetables  Tomatoes. Tomato juice. Tomato and spaghetti sauce. Chili peppers. Onion and garlic. Horseradish.  Fruits  Oranges, grapefruit, and lemon (fruit and juice).  Meats  High-fat meats, fish, and poultry. This includes hot dogs, ribs, ham, sausage, salami, and bacon.  Dairy  Whole milk and chocolate milk. Sour cream. Cream. Butter. Ice cream. Cream cheese.   Beverages  Coffee and tea, with or without caffeine. Carbonated beverages or energy drinks.  Condiments  Hot sauce. Barbecue sauce.   Sweets/Desserts  Chocolate and cocoa. Donuts. Peppermint and spearmint.  Fats and Oils  High-fat foods, including French fries and potato chips.  Other  Vinegar. Strong spices, such as black pepper, white pepper, red pepper, cayenne, curry powder, cloves, ginger, and chili powder.  The items listed above may not be a complete list of foods and beverages to avoid. Contact your dietitian for more information.  Document Released: 09/29/2005 Document Revised: 10/04/2013 Document Reviewed: 08/03/2013  ExitCare® Patient Information ©2015 [employer]. This information is not intended to replace advice given to you by your health care provider. Make sure you discuss any questions you have with your  health care provider.

## 2015-02-26 NOTE — ED Notes (Signed)
Pt appears anxious, pt reports shortness of breath at times, pt reports increased stress at work, pt reports night sweats, pt states" I think this could be stress , my MD told me to come to the ER to get checked out"

## 2015-02-26 NOTE — ED Notes (Signed)
Patient approached this RN stating, "Cut this thing off my wrist. If this is what our healthcare system does, I'm leaving. I'll go home and die." This RN discussed situation and delays with patient; encouraged patient to remain to be seen by EDP. Patient reports increased stress in his life, states "There's nothing you can do about it." Camera operator, Marya Amsler, notified.

## 2015-02-26 NOTE — ED Notes (Signed)
Pt to ed with c/o shortness of breath since Thursday pm.  Pt states "it is not severe, but I feel like I can't take a deep breath".  Pt reports joint aching, fever, mild cough.

## 2015-02-27 NOTE — Telephone Encounter (Signed)
Left message on cell phone to call back

## 2015-02-28 NOTE — Telephone Encounter (Signed)
Spoke with patient and he is just really stressed. He has follow up scheduled for 03/02/15.

## 2015-03-02 ENCOUNTER — Ambulatory Visit (INDEPENDENT_AMBULATORY_CARE_PROVIDER_SITE_OTHER): Payer: Managed Care, Other (non HMO) | Admitting: Family Medicine

## 2015-03-02 ENCOUNTER — Encounter: Payer: Self-pay | Admitting: Family Medicine

## 2015-03-02 VITALS — BP 120/74 | HR 75 | Temp 97.8°F | Wt 237.5 lb

## 2015-03-02 DIAGNOSIS — F411 Generalized anxiety disorder: Secondary | ICD-10-CM | POA: Diagnosis not present

## 2015-03-02 DIAGNOSIS — K219 Gastro-esophageal reflux disease without esophagitis: Secondary | ICD-10-CM | POA: Diagnosis not present

## 2015-03-02 MED ORDER — PANTOPRAZOLE SODIUM 40 MG PO TBEC: 40.0000 mg | DELAYED_RELEASE_TABLET | Freq: Every day | ORAL | Status: DC

## 2015-03-02 MED ORDER — CITALOPRAM HYDROBROMIDE 10 MG PO TABS: 10.0000 mg | ORAL_TABLET | Freq: Every day | ORAL | Status: DC

## 2015-03-02 NOTE — Assessment & Plan Note (Signed)
rec start protonix 40mg  daily for next 3-4 wks then prn. Update with effect. Failed rolaids, tums, zantac Nexium OTC effective but pt didn't like daily medication.

## 2015-03-02 NOTE — Patient Instructions (Addendum)
For heartburn - let's start protonix (pantoprazole) 40mg  daily for next 3-4 weeks then as needed. For anxiety - questionairre today. Start celexa 10mg  daily. This will take 3-4 weeks to take effect. Let us know if not tolerating well. Return in 4-6 weeks for follow up. Work on healthy stress relieving strategies.   Generalized Anxiety Disorder Generalized anxiety disorder (GAD) is a mental disorder. It interferes with life functions, including relationships, work, and school. GAD is different from normal anxiety, which everyone experiences at some point in their lives in response to specific life events and activities. Normal anxiety actually helps [place] prepare for and get through these life events and activities. Normal anxiety goes away after the event or activity is over.  GAD causes anxiety that is not necessarily related to specific events or activities. It also causes excess anxiety in proportion to specific events or activities. The anxiety associated with GAD is also difficult to control. GAD can vary from mild to severe. People with severe GAD can have intense waves of anxiety with physical symptoms (panic attacks).  SYMPTOMS The anxiety and worry associated with GAD are difficult to control. This anxiety and worry are related to many life events and activities and also occur more days than not for 6 months or longer. People with GAD also have three or more of the following symptoms (one or more in children):  Restlessness.   Fatigue.  Difficulty concentrating.   Irritability.  Muscle tension.  Difficulty sleeping or unsatisfying sleep. DIAGNOSIS GAD is diagnosed through an assessment by your health care provider. Your health care provider will ask you questions aboutyour mood,physical symptoms, and events in your life. Your health care provider may ask you about your medical history and use of alcohol or drugs, including prescription medicines. Your health care provider may also  do a physical exam and blood tests. Certain medical conditions and the use of certain substances can cause symptoms similar to those associated with GAD. Your health care provider may refer you to a mental health specialist for further evaluation. TREATMENT The following therapies are usually used to treat GAD:   Medication. Antidepressant medication usually is prescribed for long-term daily control. Antianxiety medicines may be added in severe cases, especially when panic attacks occur.   Talk therapy (psychotherapy). Certain types of talk therapy can be helpful in treating GAD by providing support, education, and guidance. A form of talk therapy called cognitive behavioral therapy can teach you healthy ways to think about and react to daily life events and activities.  Stress managementtechniques. These include yoga, meditation, and exercise and can be very helpful when they are practiced regularly. A mental health specialist can help determine which treatment is best for you. Some people see improvement with one therapy. However, other people require a combination of therapies. Document Released: 01/24/2013 Document Revised: 02/13/2014 Document Reviewed: 01/24/2013 [facility] Patient Information 2015 [place], [place]. This information is not intended to replace advice given to you by your health care provider. Make sure you discuss any questions you have with your health care provider.

## 2015-03-02 NOTE — Assessment & Plan Note (Addendum)
Definitely meets criteria for GAD. Sunday/Monday episode sounds like anxiety attack, s/p ER evaluation with negative cardiac workup (TnI and EKG). Discussed etiology and treatment options. Pt declines PRN benzo, actually interested in celexa because coworker has had good success with this medication. Will start celexa 10mg  daily and discussed common side effects. Discussed possible suicidality on SSRI. Discussed importance of healthy coping strategies. RTC 4-6 wks f/u. Pt agrees with plan. PHQ9 = 20/27, very difficult to function GAD7 = 19/21.

## 2015-03-02 NOTE — Progress Notes (Signed)
Pre visit review using our clinic review tool, if applicable. No additional management support is needed unless otherwise documented below in the visit note. 

## 2015-03-02 NOTE — Progress Notes (Signed)
BP 120/74 mmHg  Pulse 75  Temp(Src) 97.8 F (36.6 C) (Oral)  Wt 237 lb 8 oz (107.729 kg)  SpO2 98%   CC: ER f/u visit  Subjective:    Patient ID: Eric Colon, male    DOB: Mar 03, 1966, 49 y.o.   MRN: [id number]  HPI: Eric Colon is a 49 y.o. male presenting on 03/02/2015 for Hospitalization Follow-up   Recent eval at ER with dyspnea and chest pain - EKG stable but read as poor R wave progression with lead 3 T wave inversion. CXR stable. Though anxiety related + GERD. No fmhx CAD. ER records available reviewed.  Notes increased stress over last year. Feeling overwhelmed with work stress - high responsibility but high performance. More anxious, snapping at ppl. Trouble sleeping, wakes up in cold sweat. This past Saturday had some difficulty breathing.   No motivation (anhedonia), decreased energy, appetite down, trouble sleeping, significant anxiety with anxiety attacks recently. + trouble shutting off brain at night time. Finds excessively worries about things Denies significant depression. No SI/HI. Occasional anxiety attacks.   GERD - prior on zantac, currently taking 1/3 bottle tums daily. Prior nexium daily but didn't want to continue.   Collects guns - would like concealed weapons permit.   Relevant past medical, surgical, family and social history reviewed and updated as indicated. Interim medical history since our last visit reviewed. Allergies and medications reviewed and updated. Current Outpatient Prescriptions on File Prior to Visit  Medication Sig  . terbinafine (LAMISIL) 250 MG tablet TAKE ONE TABLET BY MOUTH ONCE DAILY   No current facility-administered medications on file prior to visit.    Review of Systems Per HPI unless specifically indicated above     Objective:    BP 120/74 mmHg  Pulse 75  Temp(Src) 97.8 F (36.6 C) (Oral)  Wt 237 lb 8 oz (107.729 kg)  SpO2 98%  Wt Readings from Last 3 Encounters:  03/02/15 237 lb 8 oz (107.729 kg)  02/26/15  234 lb (106.142 kg)  03/31/14 225 lb 8 oz (102.286 kg)    Physical Exam  Constitutional: He appears well-developed and well-nourished. No distress.  HENT:  Mouth/Throat: Oropharynx is clear and moist. No oropharyngeal exudate.  Cardiovascular: Normal rate, regular rhythm, normal heart sounds and intact distal pulses.   No murmur heard. Pulmonary/Chest: Effort normal and breath sounds normal. No respiratory distress. He has no wheezes. He has no rales.  Skin: Skin is warm and dry. No rash noted.  Psychiatric: His speech is normal and behavior is normal. Thought content normal. His mood appears anxious.  restless  Nursing note and vitals reviewed.  Results for orders placed or performed during the hospital encounter of 02/26/15  CBC  Result Value Ref Range   WBC 3.7 (L) 3.8 - 10.6 K/uL   RBC 5.53 4.40 - 5.90 MIL/uL   Hemoglobin 16.2 13.0 - 18.0 g/dL   HCT 47.7 40.0 - 52.0 %   MCV 86.2 80.0 - 100.0 fL   MCH 29.2 26.0 - 34.0 pg   MCHC 33.9 32.0 - 36.0 g/dL   RDW 13.1 11.5 - 14.5 %   Platelets 163 150 - 440 K/uL  Troponin I  Result Value Ref Range   Troponin I <0.03 <0.031 ng/mL  Basic metabolic panel  Result Value Ref Range   Sodium 138 135 - 145 mmol/L   Potassium 4.8 3.5 - 5.1 mmol/L   Chloride 105 101 - 111 mmol/L   CO2 27 22 - 32  mmol/L   Glucose, Bld 108 (H) 65 - 99 mg/dL   BUN 23 (H) 6 - 20 mg/dL   Creatinine, Ser 1.37 (H) 0.61 - 1.24 mg/dL   Calcium 9.1 8.9 - 10.3 mg/dL   GFR calc non Af Amer >60 >60 mL/min   GFR calc Af Amer >60 >60 mL/min   Anion gap 6 5 - 15      Assessment & Plan:   Problem List Items Addressed This Visit    GAD (generalized anxiety disorder) - Primary    Definitely meets criteria for GAD. Sunday/Monday episode sounds like anxiety attack, s/p ER evaluation with negative cardiac workup (TnI and EKG). Discussed etiology and treatment options. Pt declines PRN benzo, actually interested in celexa because coworker has had good success with this  medication. Will start celexa 10mg  daily and discussed common side effects. Discussed possible suicidality on SSRI. Discussed importance of healthy coping strategies. RTC 4-6 wks f/u. Pt agrees with plan. PHQ9 = 20/27, very difficult to function GAD7 = 19/21.      GERD (gastroesophageal reflux disease)    rec start protonix 40mg  daily for next 3-4 wks then prn. Update with effect. Failed rolaids, tums, zantac Nexium OTC effective but pt didn't like daily medication.      Relevant Medications   pantoprazole (PROTONIX) 40 MG tablet       Follow up plan: Return in about 4 weeks (around 03/30/2015), or as needed, for follow up visit.

## 2015-04-02 ENCOUNTER — Ambulatory Visit (INDEPENDENT_AMBULATORY_CARE_PROVIDER_SITE_OTHER): Payer: Managed Care, Other (non HMO) | Admitting: Family Medicine

## 2015-04-02 ENCOUNTER — Encounter: Payer: Self-pay | Admitting: Family Medicine

## 2015-04-02 VITALS — BP 122/88 | HR 67 | Temp 98.0°F | Wt 241.2 lb

## 2015-04-02 DIAGNOSIS — F411 Generalized anxiety disorder: Secondary | ICD-10-CM | POA: Diagnosis not present

## 2015-04-02 DIAGNOSIS — K219 Gastro-esophageal reflux disease without esophagitis: Secondary | ICD-10-CM | POA: Diagnosis not present

## 2015-04-02 MED ORDER — CITALOPRAM HYDROBROMIDE 10 MG PO TABS: 10.0000 mg | ORAL_TABLET | Freq: Every day | ORAL | Status: DC

## 2015-04-02 MED ORDER — PANTOPRAZOLE SODIUM 40 MG PO TBEC: 40.0000 mg | DELAYED_RELEASE_TABLET | ORAL | Status: DC

## 2015-04-02 NOTE — Progress Notes (Signed)
Pre visit review using our clinic review tool, if applicable. No additional management support is needed unless otherwise documented below in the visit note. 

## 2015-04-02 NOTE — Assessment & Plan Note (Signed)
Symptoms doing much better with MWF dosing. Continue. refilled today.

## 2015-04-02 NOTE — Progress Notes (Signed)
   BP 122/88 mmHg  Pulse 67  Temp(Src) 98 F (36.7 C) (Oral)  Wt 241 lb 4 oz (109.43 kg)  SpO2 98%   CC: 1 mo f/u visit  Subjective:    Patient ID: Eric Colon, male    DOB: [birth year], 49 y.o.   MRN: [id number]  HPI: Eric Colon is a 49 y.o. male presenting on 04/02/2015 for Follow-up   Dx last visit with GAD with anxiety attack and started on celexa 10mg  daily. Did not want benzo trial. He can tell a difference, wife can tell a difference. Changing jobs with less stress (July 5th) but wife wants him to continue medication. Takes celexa 10mg  at night time.   GERD - started on protonix 40mg  daily. Significantly improved. Took for 2 weeks daily. Taking MWF now.   Relevant past medical, surgical, family and social history reviewed and updated as indicated. Interim medical history since our last visit reviewed. Allergies and medications reviewed and updated. Current Outpatient Prescriptions on File Prior to Visit  Medication Sig  . terbinafine (LAMISIL) 250 MG tablet TAKE ONE TABLET BY MOUTH ONCE DAILY   No current facility-administered medications on file prior to visit.    Review of Systems Per HPI unless specifically indicated above     Objective:    BP 122/88 mmHg  Pulse 67  Temp(Src) 98 F (36.7 C) (Oral)  Wt 241 lb 4 oz (109.43 kg)  SpO2 98%  Wt Readings from Last 3 Encounters:  04/02/15 241 lb 4 oz (109.43 kg)  03/02/15 237 lb 8 oz (107.729 kg)  02/26/15 234 lb (106.142 kg)    Physical Exam  Constitutional: He appears well-developed and well-nourished. No distress.  HENT:  Mouth/Throat: Oropharynx is clear and moist. No oropharyngeal exudate.  Cardiovascular: Normal rate, regular rhythm, normal heart sounds and intact distal pulses.   No murmur heard. Pulmonary/Chest: Effort normal and breath sounds normal. No respiratory distress. He has no wheezes. He has no rales.  Abdominal: Soft. Normal appearance and bowel sounds are normal. He exhibits no  distension and no mass. There is no hepatosplenomegaly. There is no tenderness. There is no rigidity, no rebound, no guarding, no CVA tenderness and negative Murphy's sign. No hernia.  Musculoskeletal: He exhibits no edema.  Skin: Skin is warm and dry. No rash noted.  Psychiatric: He has a normal mood and affect.  Nursing note and vitals reviewed.     Assessment & Plan:   Problem List Items Addressed This Visit    GAD (generalized anxiety disorder) - Primary    Significant improvement on celexa 10mg  daily - pt desires to continue this dose. Refilled today. PHQ9 = 20 --> 5, not at all difficult to function GAD7 = 19 --> 6      GERD (gastroesophageal reflux disease)    Symptoms doing much better with MWF dosing. Continue. refilled today.      Relevant Medications   pantoprazole (PROTONIX) 40 MG tablet       Follow up plan: Return in about 6 months (around 10/02/2015), or if symptoms worsen or fail to improve, for annual exam, prior fasting for blood work.

## 2015-04-02 NOTE — Assessment & Plan Note (Addendum)
Significant improvement on celexa 10mg  daily - pt desires to continue this dose. Refilled today. PHQ9 = 20 --> 5, not at all difficult to function GAD7 = 19 --> 6

## 2015-04-02 NOTE — Patient Instructions (Addendum)
I'm glad you're doing well. Continue celexa 10mg  daily and protonix 40mg  three times weekly. meds refilled today Let us know if any concerns develop. Return as needed or in 6 months for physical.

## 2015-09-08 ENCOUNTER — Other Ambulatory Visit: Payer: Self-pay | Admitting: Family Medicine

## 2015-09-08 DIAGNOSIS — N289 Disorder of kidney and ureter, unspecified: Secondary | ICD-10-CM

## 2015-09-08 DIAGNOSIS — E669 Obesity, unspecified: Secondary | ICD-10-CM

## 2015-09-10 ENCOUNTER — Other Ambulatory Visit (INDEPENDENT_AMBULATORY_CARE_PROVIDER_SITE_OTHER): Payer: BLUE CROSS/BLUE SHIELD

## 2015-09-10 DIAGNOSIS — N289 Disorder of kidney and ureter, unspecified: Secondary | ICD-10-CM

## 2015-09-10 DIAGNOSIS — E669 Obesity, unspecified: Secondary | ICD-10-CM | POA: Diagnosis not present

## 2015-09-10 LAB — LIPID PANEL
Cholesterol: 209 mg/dL — ABNORMAL HIGH (ref 0–200)
HDL: 47.5 mg/dL (ref >39.00)
LDL Cholesterol: 145 mg/dL — ABNORMAL HIGH (ref 0–99)
NonHDL: 161.32
Total CHOL/HDL Ratio: 4
Triglycerides: 80 mg/dL (ref 0.0–149.0)
VLDL: 16 mg/dL (ref 0.0–40.0)

## 2015-09-10 LAB — COMPREHENSIVE METABOLIC PANEL
ALT: 22 U/L (ref 0–53)
AST: 13 U/L (ref 0–37)
Albumin: 4.2 g/dL (ref 3.5–5.2)
Alkaline Phosphatase: 58 U/L (ref 39–117)
BUN: 20 mg/dL (ref 6–23)
CO2: 27 mEq/L (ref 19–32)
Calcium: 9.1 mg/dL (ref 8.4–10.5)
Chloride: 104 mEq/L (ref 96–112)
Creatinine, Ser: 1.52 mg/dL — ABNORMAL HIGH (ref 0.40–1.50)
GFR: 52.29 mL/min — ABNORMAL LOW (ref >60.00)
Glucose, Bld: 105 mg/dL — ABNORMAL HIGH (ref 70–99)
Potassium: 4.2 mEq/L (ref 3.5–5.1)
Sodium: 138 mEq/L (ref 135–145)
Total Bilirubin: 0.7 mg/dL (ref 0.2–1.2)
Total Protein: 6.9 g/dL (ref 6.0–8.3)

## 2015-09-13 ENCOUNTER — Encounter: Payer: Managed Care, Other (non HMO) | Admitting: Family Medicine

## 2015-09-18 ENCOUNTER — Encounter: Payer: Self-pay | Admitting: Family Medicine

## 2015-09-18 ENCOUNTER — Ambulatory Visit (INDEPENDENT_AMBULATORY_CARE_PROVIDER_SITE_OTHER): Payer: BLUE CROSS/BLUE SHIELD | Admitting: Family Medicine

## 2015-09-18 VITALS — BP 126/64 | HR 84 | Temp 98.4°F | Ht 72.0 in | Wt 242.0 lb

## 2015-09-18 DIAGNOSIS — N289 Disorder of kidney and ureter, unspecified: Secondary | ICD-10-CM

## 2015-09-18 DIAGNOSIS — Z0000 Encounter for general adult medical examination without abnormal findings: Secondary | ICD-10-CM

## 2015-09-18 DIAGNOSIS — K649 Unspecified hemorrhoids: Secondary | ICD-10-CM

## 2015-09-18 DIAGNOSIS — F411 Generalized anxiety disorder: Secondary | ICD-10-CM

## 2015-09-18 DIAGNOSIS — Z0001 Encounter for general adult medical examination with abnormal findings: Secondary | ICD-10-CM | POA: Insufficient documentation

## 2015-09-18 DIAGNOSIS — M7712 Lateral epicondylitis, left elbow: Secondary | ICD-10-CM

## 2015-09-18 DIAGNOSIS — K219 Gastro-esophageal reflux disease without esophagitis: Secondary | ICD-10-CM

## 2015-09-18 DIAGNOSIS — E669 Obesity, unspecified: Secondary | ICD-10-CM

## 2015-09-18 MED ORDER — PANTOPRAZOLE SODIUM 40 MG PO TBEC: 40.0000 mg | DELAYED_RELEASE_TABLET | ORAL | Status: DC

## 2015-09-18 NOTE — Assessment & Plan Note (Signed)
Pt states this is stable. Regular with liqiuid fiber supplement. Declines further evaluation.

## 2015-09-18 NOTE — Progress Notes (Signed)
BP 126/64 mmHg  Pulse 84  Temp(Src) 98.4 F (36.9 C) (Oral)  Ht 6' (1.829 m)  Wt 242 lb (109.77 kg)  BMI 32.81 kg/m2   CC: CPE  Subjective:    Patient ID: Eric Colon, male    DOB: Nov 24, 1965, 49 y.o.   MRN: [id number]  HPI: Eric Colon is a 49 y.o. male presenting on 09/18/2015 for Annual Exam   Recent dx GAD with anxiety attacks s/p hospitalization. On celexa 10mg  daily and very stable. Actually stopped celexa and feels he is doing well.   GERD - continues protonix 40mg  MWF.   Some intermittent L elbow pain. Currently not bothersome. Joint pian   Preventative: Flu shot declines Tetanus - unsure Seat belt use discussed Sunscreen use discussed. No changing moles.   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 Interim medical history since our last visit reviewed. Allergies and medications reviewed and updated. No current outpatient prescriptions on file prior to visit.   No current facility-administered medications on file prior to visit.    Review of Systems  Constitutional: Negative for fever, chills, activity change, appetite change, fatigue and unexpected weight change.  HENT: Negative for hearing loss.   Eyes: Negative for visual disturbance.  Respiratory: Negative for cough, chest tightness, shortness of breath and wheezing.   Cardiovascular: Negative for chest pain, palpitations and leg swelling.  Gastrointestinal: Positive for blood in stool (?hemorrhoid). Negative for nausea, vomiting, abdominal pain, diarrhea, constipation and abdominal distention.  Genitourinary: Negative for hematuria and difficulty urinating.  Musculoskeletal: Negative for myalgias, arthralgias and neck pain.  Skin: Negative for rash.    Neurological: Negative for dizziness, seizures, syncope and headaches.  Hematological: Negative for adenopathy. Does not bruise/bleed easily.  Psychiatric/Behavioral: Negative for dysphoric mood. The patient is not nervous/anxious.    Per HPI unless specifically indicated in ROS section     Objective:    BP 126/64 mmHg  Pulse 84  Temp(Src) 98.4 F (36.9 C) (Oral)  Ht 6' (1.829 m)  Wt 242 lb (109.77 kg)  BMI 32.81 kg/m2  Wt Readings from Last 3 Encounters:  09/18/15 242 lb (109.77 kg)  04/02/15 241 lb 4 oz (109.43 kg)  03/02/15 237 lb 8 oz (107.729 kg)    Physical Exam  Constitutional: He is oriented to person, place, and time. He appears well-developed and well-nourished. No distress.  HENT:  Head: Normocephalic and atraumatic.  Right Ear: Hearing, tympanic membrane, external ear and ear canal normal.  Left Ear: Hearing, tympanic membrane, external ear and ear canal normal.  Nose: Nose normal.  Mouth/Throat: Uvula is midline, oropharynx is clear and moist and mucous membranes are normal. No oropharyngeal exudate, posterior oropharyngeal edema or posterior oropharyngeal erythema.  Eyes: Conjunctivae and EOM are normal. Pupils are equal, round, and reactive to light. No scleral icterus.  Neck: Normal range of motion. Neck supple. No thyromegaly present.  Cardiovascular: Normal rate, regular rhythm, normal heart sounds and intact distal pulses.   No murmur heard. Pulses:      Radial pulses are 2+ on the right side, and 2+ on the left side.  Pulmonary/Chest: Effort normal and breath sounds normal. No respiratory distress. He has no wheezes. He has no rales.  Abdominal: Soft. Bowel sounds are normal. He exhibits no distension and  no mass. There is no tenderness. There is no rebound and no guarding.  Musculoskeletal: Normal range of motion. He exhibits no edema.  FROM bilateral elbows Tender to palpation L lateral epicondyle without swelling or erythema  Lymphadenopathy:    He  has no cervical adenopathy.  Neurological: He is alert and oriented to person, place, and time.  CN grossly intact, station and gait intact  Skin: Skin is warm and dry. No rash noted.  Psychiatric: He has a normal mood and affect. His behavior is normal. Judgment and thought content normal.  Nursing note and vitals reviewed.  Results for orders placed or performed in visit on 09/10/15  Lipid panel  Result Value Ref Range   Cholesterol 209 (H) 0 - 200 mg/dL   Triglycerides 80.0 0.0 - 149.0 mg/dL   HDL 47.50 >39.00 mg/dL   VLDL 16.0 0.0 - 40.0 mg/dL   LDL Cholesterol 145 (H) 0 - 99 mg/dL   Total CHOL/HDL Ratio 4    NonHDL 161.32   Comprehensive metabolic panel  Result Value Ref Range   Sodium 138 135 - 145 mEq/L   Potassium 4.2 3.5 - 5.1 mEq/L   Chloride 104 96 - 112 mEq/L   CO2 27 19 - 32 mEq/L   Glucose, Bld 105 (H) 70 - 99 mg/dL   BUN 20 6 - 23 mg/dL   Creatinine, Ser 1.52 (H) 0.40 - 1.50 mg/dL   Total Bilirubin 0.7 0.2 - 1.2 mg/dL   Alkaline Phosphatase 58 39 - 117 U/L   AST 13 0 - 37 U/L   ALT 22 0 - 53 U/L   Total Protein 6.9 6.0 - 8.3 g/dL   Albumin 4.2 3.5 - 5.2 g/dL   Calcium 9.1 8.4 - 10.5 mg/dL   GFR 52.29 (L) >60.00 mL/min      Assessment & Plan:   Problem List Items Addressed This Visit    Renal insufficiency    Cr has persisted in 1.3-1.5 range over last 2 years. Will return in 4-6 mo for rpt labs and complete workup including renal US.      Obesity, Class I, BMI 30-34.9    Encouraged weight loss. Recommended increased exercise.      Left lateral epicondylitis    Reviewed with patient, recommended he buy tennis elbow strap and use tylenol prn discomfort. Provided with tennis elbow exercises from [facility] pt advisor.      Hemorrhoids    Pt states this is stable. Regular with liqiuid fiber supplement. Declines further evaluation.       Health maintenance examination - Primary    Preventative protocols reviewed and updated unless pt declined. Discussed  healthy diet and lifestyle.       GERD (gastroesophageal reflux disease)    Restart protonix 40mg  MWF.       Relevant Medications   pantoprazole (PROTONIX) 40 MG tablet   GAD (generalized anxiety disorder)    Now off celexa - states stress has improved with job change.           Follow up plan: Return in about 1 year (around 09/17/2016), or as needed, for annual exam, prior fasting for blood work.

## 2015-09-18 NOTE — Assessment & Plan Note (Signed)
Now off celexa - states stress has improved with job change.

## 2015-09-18 NOTE — Assessment & Plan Note (Signed)
Cr has persisted in 1.3-1.5 range over last 2 years. Will return in 4-6 mo for rpt labs and complete workup including renal US.

## 2015-09-18 NOTE — Assessment & Plan Note (Addendum)
Encouraged weight loss. Recommended increased exercise.

## 2015-09-18 NOTE — Assessment & Plan Note (Signed)
Restart protonix 40mg  MWF.

## 2015-09-18 NOTE — Assessment & Plan Note (Signed)
Preventative protocols reviewed and updated unless pt declined. Discussed healthy diet and lifestyle.  

## 2015-09-18 NOTE — Assessment & Plan Note (Signed)
Reviewed with patient, recommended he buy tennis elbow strap and use tylenol prn discomfort. Provided with tennis elbow exercises from [facility] pt advisor.

## 2015-09-18 NOTE — Patient Instructions (Addendum)
Return in 6 months for lab visit.  Increase water intake and avoid anti inflammatories.  Tennis elbow - buy elbow strap and do exercises provided today. Tylenol is ok  Health Maintenance, Male A healthy lifestyle and preventative care can promote health and wellness.  Maintain regular health, dental, and eye exams.  Eat a healthy diet. Foods like vegetables, fruits, whole grains, low-fat dairy products, and lean protein foods contain the nutrients you need and are low in calories. Decrease your intake of foods high in solid fats, added sugars, and salt. Get information about a proper diet from your health care provider, if necessary.  Regular physical exercise is one of the most important things you can do for your health. Most adults should get at least 150 minutes of moderate-intensity exercise (any activity that increases your heart rate and causes you to sweat) each week. In addition, most adults need muscle-strengthening exercises on 2 or more days a week.   Maintain a healthy weight. The body mass index (BMI) is a screening tool to identify possible weight problems. It provides an estimate of body fat based on height and weight. Your health care provider can find your BMI and can help you achieve or maintain a healthy weight. For males 20 years and older:  A BMI below 18.5 is considered underweight.  A BMI of 18.5 to 24.9 is normal.  A BMI of 25 to 29.9 is considered overweight.  A BMI of 30 and above is considered obese.  Maintain normal blood lipids and cholesterol by exercising and minimizing your intake of saturated fat. Eat a balanced diet with plenty of fruits and vegetables. Blood tests for lipids and cholesterol should begin at age 14 and be repeated every 5 years. If your lipid or cholesterol levels are high, you are over age 41, or you are at high risk for heart disease, you may need your cholesterol levels checked more frequently.Ongoing high lipid and cholesterol levels  should be treated with medicines if diet and exercise are not working.  If you smoke, find out from your health care provider how to quit. If you do not use tobacco, do not start.  Lung cancer screening is recommended for adults aged 45-80 years who are at high risk for developing lung cancer because of a history of smoking. A yearly low-dose CT scan of the lungs is recommended for people who have at least a 30-pack-year history of smoking and are current smokers or have quit within the past 15 years. A pack year of smoking is smoking an average of 1 pack of cigarettes a day for 1 year (for example, a 30-pack-year history of smoking could mean smoking 1 pack a day for 30 years or 2 packs a day for 15 years). Yearly screening should continue until the smoker has stopped smoking for at least 15 years. Yearly screening should be stopped for people who develop a health problem that would prevent them from having lung cancer treatment.  If you choose to drink alcohol, do not have more than 2 drinks per day. One drink is considered to be 12 oz (360 mL) of beer, 5 oz (150 mL) of wine, or 1.5 oz (45 mL) of liquor.  Avoid the use of street drugs. Do not share needles with anyone. Ask for help if you need support or instructions about stopping the use of drugs.  High blood pressure causes heart disease and increases the risk of stroke. High blood pressure is more likely to develop  in:  People who have blood pressure in the end of the normal range (100-139/85-89 mm Hg).  People who are overweight or obese.  People who are African American.  If you are 41-26 years of age, have your blood pressure checked every 3-5 years. If you are 44 years of age or older, have your blood pressure checked every year. You should have your blood pressure measured twice--once when you are at a hospital or clinic, and once when you are not at a hospital or clinic. Record the average of the two measurements. To check your blood  pressure when you are not at a hospital or clinic, you can use:  An automated blood pressure machine at a pharmacy.  A home blood pressure monitor.  If you are 27-4 years old, ask your health care provider if you should take aspirin to prevent heart disease.  Diabetes screening involves taking a blood sample to check your fasting blood sugar level. This should be done once every 3 years after age 52 if you are at a normal weight and without risk factors for diabetes. Testing should be considered at a younger age or be carried out more frequently if you are overweight and have at least 1 risk factor for diabetes.  Colorectal cancer can be detected and often prevented. Most routine colorectal cancer screening begins at the age of 40 and continues through age 24. However, your health care provider may recommend screening at an earlier age if you have risk factors for colon cancer. On a yearly basis, your health care provider may provide home test kits to check for hidden blood in the stool. A small camera at the end of a tube may be used to directly examine the colon (sigmoidoscopy or colonoscopy) to detect the earliest forms of colorectal cancer. Talk to your health care provider about this at age 57 when routine screening begins. A direct exam of the colon should be repeated every 5-10 years through age 21, unless early forms of precancerous polyps or small growths are found.  People who are at an increased risk for hepatitis B should be screened for this virus. You are considered at high risk for hepatitis B if:  You were born in a country where hepatitis B occurs often. Talk with your health care provider about which countries are considered high risk.  Your parents were born in a high-risk country and you have not received a shot to protect against hepatitis B (hepatitis B vaccine).  You have HIV or AIDS.  You use needles to inject street drugs.  You live with, or have sex with, someone who  has hepatitis B.  You are a man who has sex with other men (MSM).  You get hemodialysis treatment.  You take certain medicines for conditions like cancer, organ transplantation, and autoimmune conditions.  Hepatitis C blood testing is recommended for all people born from [birth year] through 1964 and any individual with known risk factors for hepatitis C.  Healthy men should no longer receive prostate-specific antigen (PSA) blood tests as part of routine cancer screening. Talk to your health care provider about prostate cancer screening.  Testicular cancer screening is not recommended for adolescents or adult males who have no symptoms. Screening includes self-exam, a health care provider exam, and other screening tests. Consult with your health care provider about any symptoms you have or any concerns you have about testicular cancer.  Practice safe sex. Use condoms and avoid high-risk sexual practices to reduce the  spread of sexually transmitted infections (STIs).  You should be screened for STIs, including gonorrhea and chlamydia if:  You are sexually active and are younger than 24 years.  You are older than 24 years, and your health care provider tells you that you are at risk for this type of infection.  Your sexual activity has changed since you were last screened, and you are at an increased risk for chlamydia or gonorrhea. Ask your health care provider if you are at risk.  If you are at risk of being infected with HIV, it is recommended that you take a prescription medicine daily to prevent HIV infection. This is called pre-exposure prophylaxis (PrEP). You are considered at risk if:  You are a man who has sex with other men (MSM).  You are a heterosexual man who is sexually active with multiple partners.  You take drugs by injection.  You are sexually active with a partner who has HIV.  Talk with your health care provider about whether you are at high risk of being infected with  HIV. If you choose to begin PrEP, you should first be tested for HIV. You should then be tested every 3 months for as long as you are taking PrEP.  Use sunscreen. Apply sunscreen liberally and repeatedly throughout the day. You should seek shade when your shadow is shorter than you. Protect yourself by wearing long sleeves, pants, a wide-brimmed hat, and sunglasses year round whenever you are outdoors.  Tell your health care provider of new moles or changes in moles, especially if there is a change in shape or color. Also, tell your health care provider if a mole is larger than the size of a pencil eraser.  A one-time screening for abdominal aortic aneurysm (AAA) and surgical repair of large AAAs by ultrasound is recommended for men aged 58-75 years who are current or former smokers.  Stay current with your vaccines (immunizations).   This information is not intended to replace advice given to you by your health care provider. Make sure you discuss any questions you have with your health care provider.   Document Released: 03/27/2008 Document Revised: 10/20/2014 Document Reviewed: 02/24/2011 Elsevier Interactive Patient Education [employer].

## 2015-09-18 NOTE — Progress Notes (Signed)
Pre visit review using our clinic review tool, if applicable. No additional management support is needed unless otherwise documented below in the visit note. 

## 2016-09-28 ENCOUNTER — Other Ambulatory Visit: Payer: Self-pay | Admitting: Family Medicine

## 2016-10-02 ENCOUNTER — Other Ambulatory Visit: Payer: Self-pay | Admitting: Family Medicine

## 2016-10-03 NOTE — Telephone Encounter (Signed)
Pt left v/m requesting refill on pantoprazole; advised already refilled; pt voiced understanding and Melissa is scheduling CPX for pt.

## 2016-10-16 ENCOUNTER — Encounter: Payer: Self-pay | Admitting: Family Medicine

## 2016-10-16 ENCOUNTER — Ambulatory Visit (INDEPENDENT_AMBULATORY_CARE_PROVIDER_SITE_OTHER): Payer: BLUE CROSS/BLUE SHIELD | Admitting: Family Medicine

## 2016-10-16 VITALS — BP 106/78 | HR 68 | Temp 98.0°F | Ht 71.0 in | Wt 242.2 lb

## 2016-10-16 DIAGNOSIS — Z23 Encounter for immunization: Secondary | ICD-10-CM | POA: Diagnosis not present

## 2016-10-16 DIAGNOSIS — R05 Cough: Secondary | ICD-10-CM | POA: Diagnosis not present

## 2016-10-16 DIAGNOSIS — N289 Disorder of kidney and ureter, unspecified: Secondary | ICD-10-CM

## 2016-10-16 DIAGNOSIS — K219 Gastro-esophageal reflux disease without esophagitis: Secondary | ICD-10-CM | POA: Diagnosis not present

## 2016-10-16 DIAGNOSIS — R55 Syncope and collapse: Secondary | ICD-10-CM | POA: Diagnosis not present

## 2016-10-16 DIAGNOSIS — Z0000 Encounter for general adult medical examination without abnormal findings: Secondary | ICD-10-CM

## 2016-10-16 DIAGNOSIS — E669 Obesity, unspecified: Secondary | ICD-10-CM

## 2016-10-16 DIAGNOSIS — R059 Cough, unspecified: Secondary | ICD-10-CM

## 2016-10-16 LAB — LIPID PANEL
Cholesterol: 186 mg/dL (ref 0–200)
HDL: 43.7 mg/dL (ref >39.00)
LDL Cholesterol: 116 mg/dL — ABNORMAL HIGH (ref 0–99)
NonHDL: 142.27
Total CHOL/HDL Ratio: 4
Triglycerides: 133 mg/dL (ref 0.0–149.0)
VLDL: 26.6 mg/dL (ref 0.0–40.0)

## 2016-10-16 LAB — COMPREHENSIVE METABOLIC PANEL
ALT: 29 U/L (ref 0–53)
AST: 13 U/L (ref 0–37)
Albumin: 4.4 g/dL (ref 3.5–5.2)
Alkaline Phosphatase: 54 U/L (ref 39–117)
BUN: 17 mg/dL (ref 6–23)
CO2: 32 mEq/L (ref 19–32)
Calcium: 9.4 mg/dL (ref 8.4–10.5)
Chloride: 105 mEq/L (ref 96–112)
Creatinine, Ser: 1.39 mg/dL (ref 0.40–1.50)
GFR: 57.71 mL/min — ABNORMAL LOW (ref >60.00)
Glucose, Bld: 107 mg/dL — ABNORMAL HIGH (ref 70–99)
Potassium: 3.9 mEq/L (ref 3.5–5.1)
Sodium: 140 mEq/L (ref 135–145)
Total Bilirubin: 0.6 mg/dL (ref 0.2–1.2)
Total Protein: 7.2 g/dL (ref 6.0–8.3)

## 2016-10-16 LAB — CBC
HCT: 43 % (ref 39.0–52.0)
Hemoglobin: 14.7 g/dL (ref 13.0–17.0)
MCHC: 34.2 g/dL (ref 30.0–36.0)
MCV: 85.3 fl (ref 78.0–100.0)
Platelets: 228 10*3/uL (ref 150.0–400.0)
RBC: 5.03 Mil/uL (ref 4.22–5.81)
RDW: 13.1 % (ref 11.5–15.5)
WBC: 6.7 10*3/uL (ref 4.0–10.5)

## 2016-10-16 LAB — TSH: TSH: 4.93 u[IU]/mL — ABNORMAL HIGH (ref 0.35–4.50)

## 2016-10-16 MED ORDER — AZITHROMYCIN 250 MG PO TABS: ORAL_TABLET | ORAL | 0 refills | Status: DC

## 2016-10-16 MED ORDER — BENZONATATE 100 MG PO CAPS: 100.0000 mg | ORAL_CAPSULE | Freq: Two times a day (BID) | ORAL | 0 refills | Status: DC | PRN

## 2016-10-16 MED ORDER — PANTOPRAZOLE SODIUM 40 MG PO TBEC: 40.0000 mg | DELAYED_RELEASE_TABLET | Freq: Every day | ORAL | 1 refills | Status: DC | PRN

## 2016-10-16 NOTE — Patient Instructions (Signed)
I have sent in an antibiotic to your pharmacy and some cough tablets Drink enough liquids to make your urine light yellow Please let us know if you have any additional episodes of passing out or if cough not better in 7-10 days    Keeping you healthy  Get these tests  Blood pressure- Have your blood pressure checked once a year by your healthcare provider.  Normal blood pressure is 120/80.  Weight- Have your body mass index (BMI) calculated to screen for obesity.  BMI is a measure of body fat based on height and weight. You can also calculate your own BMI at [employer].  Cholesterol- Have your cholesterol checked regularly starting at age 64, sooner may be necessary if you have diabetes, high blood pressure, if a family member developed heart diseases at an early age or if you smoke.   Chlamydia, HIV, and other sexual transmitted disease- Get screened each year until the age of 40 then within three months of each new sexual partner.  Diabetes- Have your blood sugar checked regularly if you have high blood pressure, high cholesterol, a family history of diabetes or if you are overweight.  Get these vaccines  Flu shot- Every fall.  Tetanus shot- Every 10 years.  Menactra- Single dose; prevents meningitis.  Take these steps  Don't smoke- If you do smoke, ask your healthcare provider about quitting. For tips on how to quit, go to www.smokefree.gov or call 1-800-QUIT-NOW.  Be physically active- Exercise 5 days a week for at least 30 minutes.  If you are not already physically active start slow and gradually work up to 30 minutes of moderate physical activity.  Examples of moderate activity include walking briskly, mowing the yard, dancing, swimming bicycling, etc.  Eat a healthy diet- Eat a variety of healthy foods such as fruits, vegetables, low fat milk, low fat cheese, yogurt, lean meats, poultry, fish, beans, tofu, etc.  For more information on healthy eating, go to  www.thenutritionsource.org  Drink alcohol in moderation- Limit alcohol intake two drinks or less a day.  Never drink and drive.  Dentist- Brush and floss teeth twice daily; visit your dentis twice a year.  Depression-Your emotional health is as important as your physical health.  If you're feeling down, losing interest in things you normally enjoy please talk with your healthcare provider.  Gun Safety- If you keep a gun in your home, keep it unloaded and with the safety lock on.  Bullets should be stored separately.  Helmet use- Always wear a helmet when riding a motorcycle, bicycle, rollerblading or skateboarding.  Safe sex- If you may be exposed to a sexually transmitted infection, use a condom  Seat belts- Seat bels can save your life; always wear one.  Smoke/Carbon Monoxide detectors- These detectors need to be installed on the appropriate level of your home.  Replace batteries at least once a year.  Skin Cancer- When out in the sun, cover up and use sunscreen SPF 15 or higher.  Violence- If anyone is threatening or hurting you, please tell your healthcare provider.

## 2016-10-16 NOTE — Progress Notes (Signed)
Subjective:    Patient ID: Eric Colon, male    DOB: 1965/11/30, 51 y.o.   MRN: [id number]  HPI This is a 51 yo male who presents today for acute visit- cough and nasal congestion as well as annual exam.   Patient with night time cough x several weeks, little during the day. Coughed so hard several nights ago that he "passed out." He was sitting on his sofa, had violent coughing fit, lost consciousness for he thinks 45 seconds, felt light headed. This event was not observed. Feels cool in his chest, coughs up green mucus and blood in the evening. Has not taken any medication for this. Feels congested Blows a lot of bloody, green mucus with hot shower. No fatigue, no fever. Feels SOB with going out into cold, no wheezing.   GERD stable, uses pantoprazole as needed. May need for several days then not for several weeks. Tries to control symptoms with diet, will take pantoprazole prophylactically. Also will use tums/rolaids for immediate relief.   Would like to loose weight, has tried watching diet and increasing exercise in the past. Wife also overweight, diets for awhile then they both stop. 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 Doesn't drink soda, drinks energy drinks.   Has had elevated creatinine. Per Dr. Bosie Clos last note, was suggested repeat CMET and renal US. Patient does not recall this and did not return for labs or have [place].     Last CPE- 12/16 PSA- na Colonoscopy- na  Tdap- needs Flu- never Dental- regular Eye- last 2 years ago Exercise- intermittent  Past Medical History:  Diagnosis Date  . GERD (gastroesophageal reflux disease)   . Hemorrhoids   . Migraines    Past Surgical History:  Procedure Laterality Date  . INGUINAL HERNIA REPAIR Right 1990   Family History  Problem Relation Age of Onset  . Cancer Mother 17    lung (smoker)  . Cancer Father 42    prostate?  . CAD Neg Hx   . Stroke Neg Hx   . Diabetes  Maternal Grandmother    Social History  Substance Use Topics  . Smoking status: Never Smoker  . Smokeless tobacco: Never Used  . Alcohol use 0.0 oz/week     Comment: 2 beers/week    Review of Systems  Constitutional: Positive for fatigue. Negative for fever and unexpected weight change.  HENT: Positive for congestion and rhinorrhea. Negative for ear pain.   Eyes: Negative for visual disturbance.  Respiratory: Positive for cough. Negative for choking, chest tightness, shortness of breath and wheezing.   Cardiovascular: Negative for chest pain, palpitations and leg swelling.  Gastrointestinal: Positive for abdominal pain (intermittent GERD). Negative for anal bleeding, blood in stool, constipation, diarrhea, nausea and vomiting.  Endocrine: Negative for polydipsia, polyphagia and polyuria.  Genitourinary: Negative for difficulty urinating, dysuria and frequency.  Neurological: Negative for headaches.  Hematological: Does not bruise/bleed easily.       Objective:   Physical Exam Physical Exam  Constitutional: He is oriented to person, place, and time. He appears well-developed and well-nourished. Obese. HENT:  Head: Normocephalic and atraumatic.  Right Ear: External ear normal.  Left Ear: External ear normal.  Nose: Nose normal.  Mouth/Throat: Oropharynx is clear and moist.  Eyes: Conjunctivae are normal. Pupils are equal, round, and reactive to light.  Neck: Normal range of motion. Neck supple.  Cardiovascular: Normal rate, regular rhythm, normal heart sounds and  intact distal pulses.   Pulmonary/Chest: Effort normal and breath sounds normal.  Abdominal: Soft. Bowel sounds are normal. Hernia confirmed negative in the right inguinal area and confirmed negative in the left inguinal area.  Genitourinary: Testes normal and penis normal. Circumcised.  Musculoskeletal: Normal range of motion. He exhibits no edema or tenderness.       Cervical back: Normal.       Thoracic back:  Normal.       Lumbar back: Normal.  Lymphadenopathy:    He has no cervical adenopathy.       Right: No inguinal adenopathy present.       Left: No inguinal adenopathy present.  Neurological: He is alert and oriented to person, place, and time. He has normal reflexes.  Skin: Skin is warm and dry.  Psychiatric: He has a normal mood and affect. His behavior is normal. Judgment normal.  Vitals reviewed.     BP 106/78 (BP Location: Right Arm, Patient Position: Sitting, Cuff Size: Large)   Pulse 68   Temp 98 F (36.7 C) (Oral)   Ht 5\' 11"  (1.803 m)   Wt 242 lb 4 oz (109.9 kg)   SpO2 98%   BMI 33.79 kg/m  Wt Readings from Last 3 Encounters:  10/16/16 242 lb 4 oz (109.9 kg)  09/18/15 242 lb (109.8 kg)  04/02/15 241 lb 4 oz (109.4 kg)   EKG- NSR    Assessment & Plan:  1. Annual physical exam - Discussed and encouraged healthy lifestyle choices- adequate sleep, regular exercise, stress management and healthy food choices.  - patient with multiple reasons why he can not make healthy food choices and increase physical activity, does not seem ready or motivated to change behavior.  2. Cough - given prolonged course, will cover with antibiotic - azithromycin (ZITHROMAX) 250 MG tablet; Take 2 tabs PO x 1 dose, then 1 tab PO QD x 4 days  Dispense: 6 tablet; Refill: 0 - benzonatate (TESSALON) 100 MG capsule; Take 1 capsule (100 mg total) by mouth 2 (two) times daily as needed for cough.  Dispense: 20 capsule; Refill: 0  3. Gastroesophageal reflux disease, esophagitis presence not specified - encouraged weight loss, avoidance of triggers - pantoprazole (PROTONIX) 40 MG tablet; Take 1 tablet (40 mg total) by mouth daily as needed.  Dispense: 30 tablet; Refill: 1  4. Need for Tdap vaccination - Tdap vaccine greater than or equal to 7yo IM  5. Obesity, Class I, BMI 30-34.9 - Lipid panel - TSH  6. Renal insufficiency - CBC - Comprehensive metabolic panel  7. Syncope, unspecified  syncope type - discussed with Dr. Danise Mina - one episode, triggered by severe coughing spell, normal EKG, will monitor for further episodes and if he has recurence, will refer to cardiology.  - EKG 12-Lead   Clarene Reamer, FNP-BC  [facility]  10/18/2016 10:56 AM

## 2016-10-16 NOTE — Progress Notes (Signed)
Pre visit review using our clinic review tool, if applicable. No additional management support is needed unless otherwise documented below in the visit note. 

## 2016-10-22 ENCOUNTER — Other Ambulatory Visit: Payer: Self-pay | Admitting: Family Medicine

## 2016-10-22 ENCOUNTER — Encounter: Payer: Self-pay | Admitting: Family Medicine

## 2016-10-22 MED ORDER — LEVOTHYROXINE SODIUM 50 MCG PO TABS: 50.0000 ug | ORAL_TABLET | Freq: Every day | ORAL | 1 refills | Status: DC

## 2016-10-24 ENCOUNTER — Encounter: Payer: BLUE CROSS/BLUE SHIELD | Admitting: Family Medicine

## 2016-10-24 ENCOUNTER — Other Ambulatory Visit: Payer: Self-pay | Admitting: *Deleted

## 2016-10-24 MED ORDER — LEVOTHYROXINE SODIUM 50 MCG PO TABS: 50.0000 ug | ORAL_TABLET | Freq: Every day | ORAL | 1 refills | Status: DC

## 2016-12-17 DIAGNOSIS — M7501 Adhesive capsulitis of right shoulder: Secondary | ICD-10-CM | POA: Diagnosis not present

## 2017-02-17 ENCOUNTER — Encounter: Payer: Self-pay | Admitting: Family Medicine

## 2017-02-17 ENCOUNTER — Ambulatory Visit (INDEPENDENT_AMBULATORY_CARE_PROVIDER_SITE_OTHER): Payer: BLUE CROSS/BLUE SHIELD | Admitting: Family Medicine

## 2017-02-17 VITALS — BP 122/76 | HR 73 | Temp 98.1°F | Wt 241.8 lb

## 2017-02-17 DIAGNOSIS — N529 Male erectile dysfunction, unspecified: Secondary | ICD-10-CM

## 2017-02-17 DIAGNOSIS — M76891 Other specified enthesopathies of right lower limb, excluding foot: Secondary | ICD-10-CM | POA: Diagnosis not present

## 2017-02-17 DIAGNOSIS — E039 Hypothyroidism, unspecified: Secondary | ICD-10-CM

## 2017-02-17 LAB — T4, FREE: Free T4: 0.79 ng/dL (ref 0.60–1.60)

## 2017-02-17 LAB — TSH: TSH: 4.19 u[IU]/mL (ref 0.35–4.50)

## 2017-02-17 MED ORDER — CYCLOBENZAPRINE HCL 10 MG PO TABS: 10.0000 mg | ORAL_TABLET | Freq: Two times a day (BID) | ORAL | 0 refills | Status: DC | PRN

## 2017-02-17 NOTE — Assessment & Plan Note (Signed)
Has seen ortho for this. Discussed. rec continue home exercises provided. Reasonable to do trial of muscle relaxant - flexeril sent to pharmacy. If not improving with exercises, rec return to ortho for further eval/treatment - discussing PT

## 2017-02-17 NOTE — Assessment & Plan Note (Signed)
Discussed causes and treatment. Recent FLP reassuring. Will check TSH. Discussed viagra, side effects and adverse effects to monitor, coupon provided in anticipation of prescribing pending TFTs.

## 2017-02-17 NOTE — Progress Notes (Signed)
BP 122/76   Pulse 73   Temp 98.1 F (36.7 C) (Oral)   Wt 241 lb 12 oz (109.7 kg)   SpO2 96%   BMI 33.72 kg/m    CC: discuss thyroid meds Subjective:    Patient ID: Eric Colon, male    DOB: [birth year], 51 y.o.   MRN: [id number]  HPI: Eric Colon is a 51 y.o. male presenting on 02/17/2017 for Follow-up (thyroid) and Shoulder Pain (Right)   Seen by Jackelyn Poling for CPE 10/2016. At that time, thyroid function was found to be mildly low with TSH at 4.93. Started on thyroid replacement 70mcg daily. He did not tolerate this well - some depression and fatigue, and low energy. Stopped and felt better, retried this and again same symptoms developed. He has been off thyroid replacement for the past 1 month.    Denies constipation, skin or hair changes. No palpitations. Some cold intolerance, endorses weight gain, fatigue and low energy.  No known fmhx thyroid disease.   Ongoing R shoulder pain over last 1 month. Evaluated by [facility] - dx with frozen shoulder. Given steroid injection and slowly improving. He has been doing home exercises at home. Massage also helped. OTC analgesics didn't help. He continues ice to shoulder. Interested in trial muscle relaxant trial.   Noticing worsening ED.   Relevant past medical, surgical, family and social history reviewed and updated as indicated. Interim medical history since our last visit reviewed. Allergies and medications reviewed and updated. Outpatient Medications Prior to Visit  Medication Sig Dispense Refill  . pantoprazole (PROTONIX) 40 MG tablet Take 1 tablet (40 mg total) by mouth daily as needed. 30 tablet 1  . azithromycin (ZITHROMAX) 250 MG tablet Take 2 tabs PO x 1 dose, then 1 tab PO QD x 4 days 6 tablet 0  . benzonatate (TESSALON) 100 MG capsule Take 1 capsule (100 mg total) by mouth 2 (two) times daily as needed for cough. 20 capsule 0  . levothyroxine (SYNTHROID, LEVOTHROID) 50 MCG tablet Take 1 tablet (50 mcg total) by mouth daily.  (Patient not taking: Reported on 02/17/2017) 90 tablet 1   No facility-administered medications prior to visit.      Per HPI unless specifically indicated in ROS section below Review of Systems     Objective:    BP 122/76   Pulse 73   Temp 98.1 F (36.7 C) (Oral)   Wt 241 lb 12 oz (109.7 kg)   SpO2 96%   BMI 33.72 kg/m   Wt Readings from Last 3 Encounters:  02/17/17 241 lb 12 oz (109.7 kg)  10/16/16 242 lb 4 oz (109.9 kg)  09/18/15 242 lb (109.8 kg)    Physical Exam  Constitutional: He appears well-developed and well-nourished. No distress.  HENT:  Mouth/Throat: Oropharynx is clear and moist. No oropharyngeal exudate.  Neck: Normal range of motion. Neck supple. No thyromegaly present.  Cardiovascular: Normal rate, regular rhythm, normal heart sounds and intact distal pulses.   No murmur heard. Pulmonary/Chest: Effort normal and breath sounds normal. No respiratory distress. He has no wheezes. He has no rales.  Musculoskeletal: He exhibits no edema.  Limited ROM active and passive R shoulder 2/2 pain Painful with rotation of humeral head in [facility] joint  Lymphadenopathy:    He has no cervical adenopathy.  Nursing note and vitals reviewed.  Results for orders placed or performed in visit on 10/16/16  CBC  Result Value Ref Range   WBC 6.7 4.0 -  10.5 K/uL   RBC 5.03 4.22 - 5.81 Mil/uL   Platelets 228.0 150.0 - 400.0 K/uL   Hemoglobin 14.7 13.0 - 17.0 g/dL   HCT 43.0 39.0 - 52.0 %   MCV 85.3 78.0 - 100.0 fl   MCHC 34.2 30.0 - 36.0 g/dL   RDW 13.1 11.5 - 15.5 %  Comprehensive metabolic panel  Result Value Ref Range   Sodium 140 135 - 145 mEq/L   Potassium 3.9 3.5 - 5.1 mEq/L   Chloride 105 96 - 112 mEq/L   CO2 32 19 - 32 mEq/L   Glucose, Bld 107 (H) 70 - 99 mg/dL   BUN 17 6 - 23 mg/dL   Creatinine, Ser 1.39 0.40 - 1.50 mg/dL   Total Bilirubin 0.6 0.2 - 1.2 mg/dL   Alkaline Phosphatase 54 39 - 117 U/L   AST 13 0 - 37 U/L   ALT 29 0 - 53 U/L   Total Protein 7.2 6.0 -  8.3 g/dL   Albumin 4.4 3.5 - 5.2 g/dL   Calcium 9.4 8.4 - 10.5 mg/dL   GFR 57.71 (L) >60.00 mL/min  Lipid panel  Result Value Ref Range   Cholesterol 186 0 - 200 mg/dL   Triglycerides 133.0 0.0 - 149.0 mg/dL   HDL 43.70 >39.00 mg/dL   VLDL 26.6 0.0 - 40.0 mg/dL   LDL Cholesterol 116 (H) 0 - 99 mg/dL   Total CHOL/HDL Ratio 4    NonHDL 142.27   TSH  Result Value Ref Range   TSH 4.93 (H) 0.35 - 4.50 uIU/mL      Assessment & Plan:  Over 25 minutes were spent face-to-face with the patient during this encounter and >50% of that time was spent on counseling and coordination of care  Problem List Items Addressed This Visit    Acquired hypothyroidism - Primary    TSH mildly elevated 10/2016 - pt did not tolerate levothyroxine well. Will update TFTs today and if persistently low, will trial synthroid.       Relevant Orders   TSH   T3   T4, free   Adhesive capsulitis of knee, right    Has seen ortho for this. Discussed. rec continue home exercises provided. Reasonable to do trial of muscle relaxant - flexeril sent to pharmacy. If not improving with exercises, rec return to ortho for further eval/treatment - discussing PT      Erectile dysfunction    Discussed causes and treatment. Recent FLP reassuring. Will check TSH. Discussed viagra, side effects and adverse effects to monitor, coupon provided in anticipation of prescribing pending TFTs.           Follow up plan: No Follow-up on file.  Ria Bush, MD

## 2017-02-17 NOTE — Patient Instructions (Addendum)
Labs today. If thyroid staying underactive, we will try brand synthroid.  For frozen shoulder - continue exercises provided by ortho. Continue ice or heating pad. May try muscle relaxant. If you want to return to ortho, let us know for referral.

## 2017-02-17 NOTE — Progress Notes (Signed)
Pre visit review using our clinic review tool, if applicable. No additional management support is needed unless otherwise documented below in the visit note. 

## 2017-02-17 NOTE — Assessment & Plan Note (Signed)
TSH mildly elevated 10/2016 - pt did not tolerate levothyroxine well. Will update TFTs today and if persistently low, will trial synthroid.

## 2017-02-18 LAB — T3: T3, Total: 108 ng/dL (ref 76–181)

## 2017-02-19 ENCOUNTER — Other Ambulatory Visit: Payer: Self-pay | Admitting: Family Medicine

## 2017-02-19 MED ORDER — SILDENAFIL CITRATE 100 MG PO TABS: 50.0000 mg | ORAL_TABLET | Freq: Every day | ORAL | 3 refills | Status: DC | PRN

## 2017-02-25 DIAGNOSIS — H524 Presbyopia: Secondary | ICD-10-CM | POA: Diagnosis not present

## 2017-03-26 IMAGING — CR DG CHEST 2V
1 series · 2 of 2 positions shown · non-contrast
Comparison: None.

CLINICAL DATA: Shortness of breath.

EXAM:
CHEST  2 VIEW

[Series 1: dg chest 2 view · 0.14mm/px · 2 of 2 slices shown]
[im 1/2]
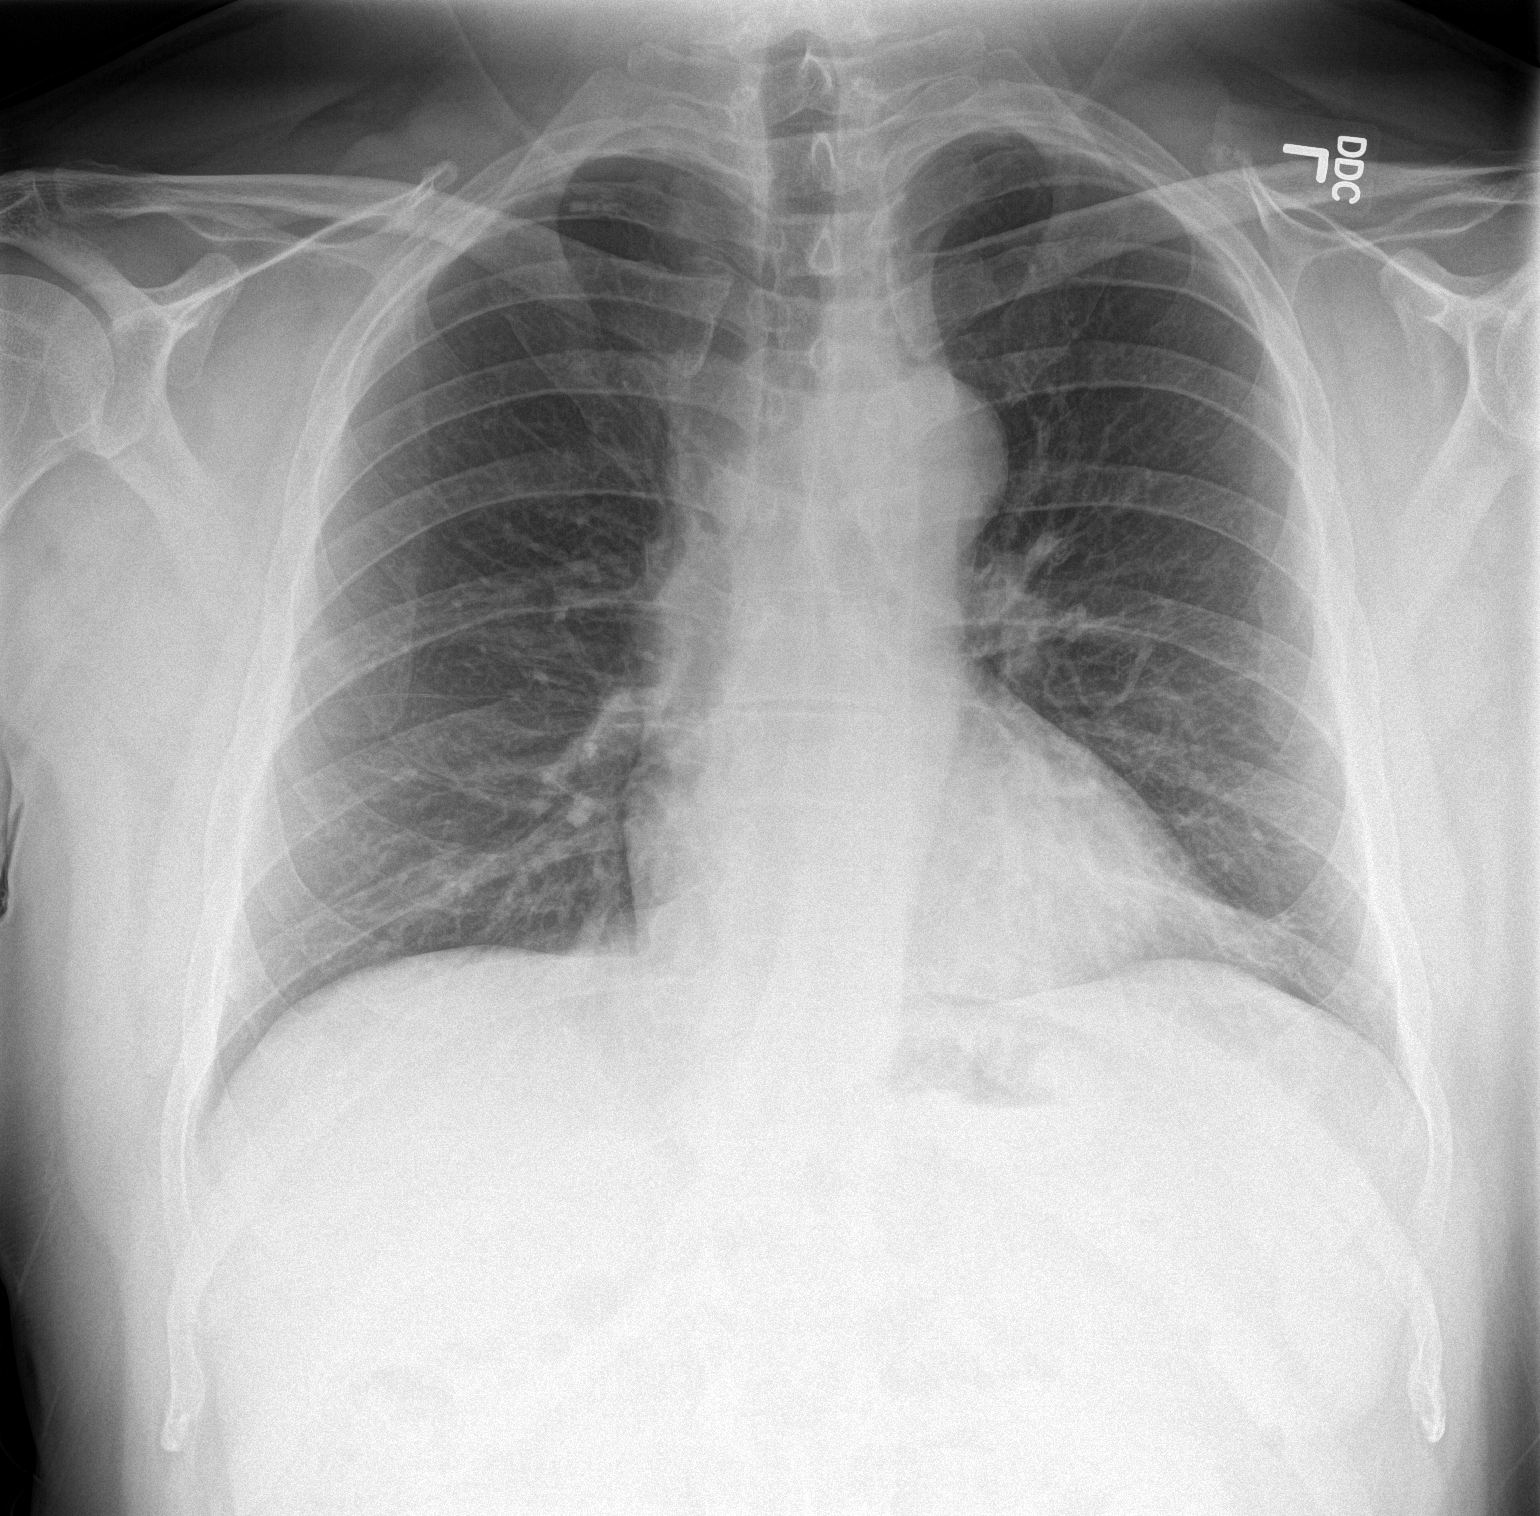
[im 2/2]
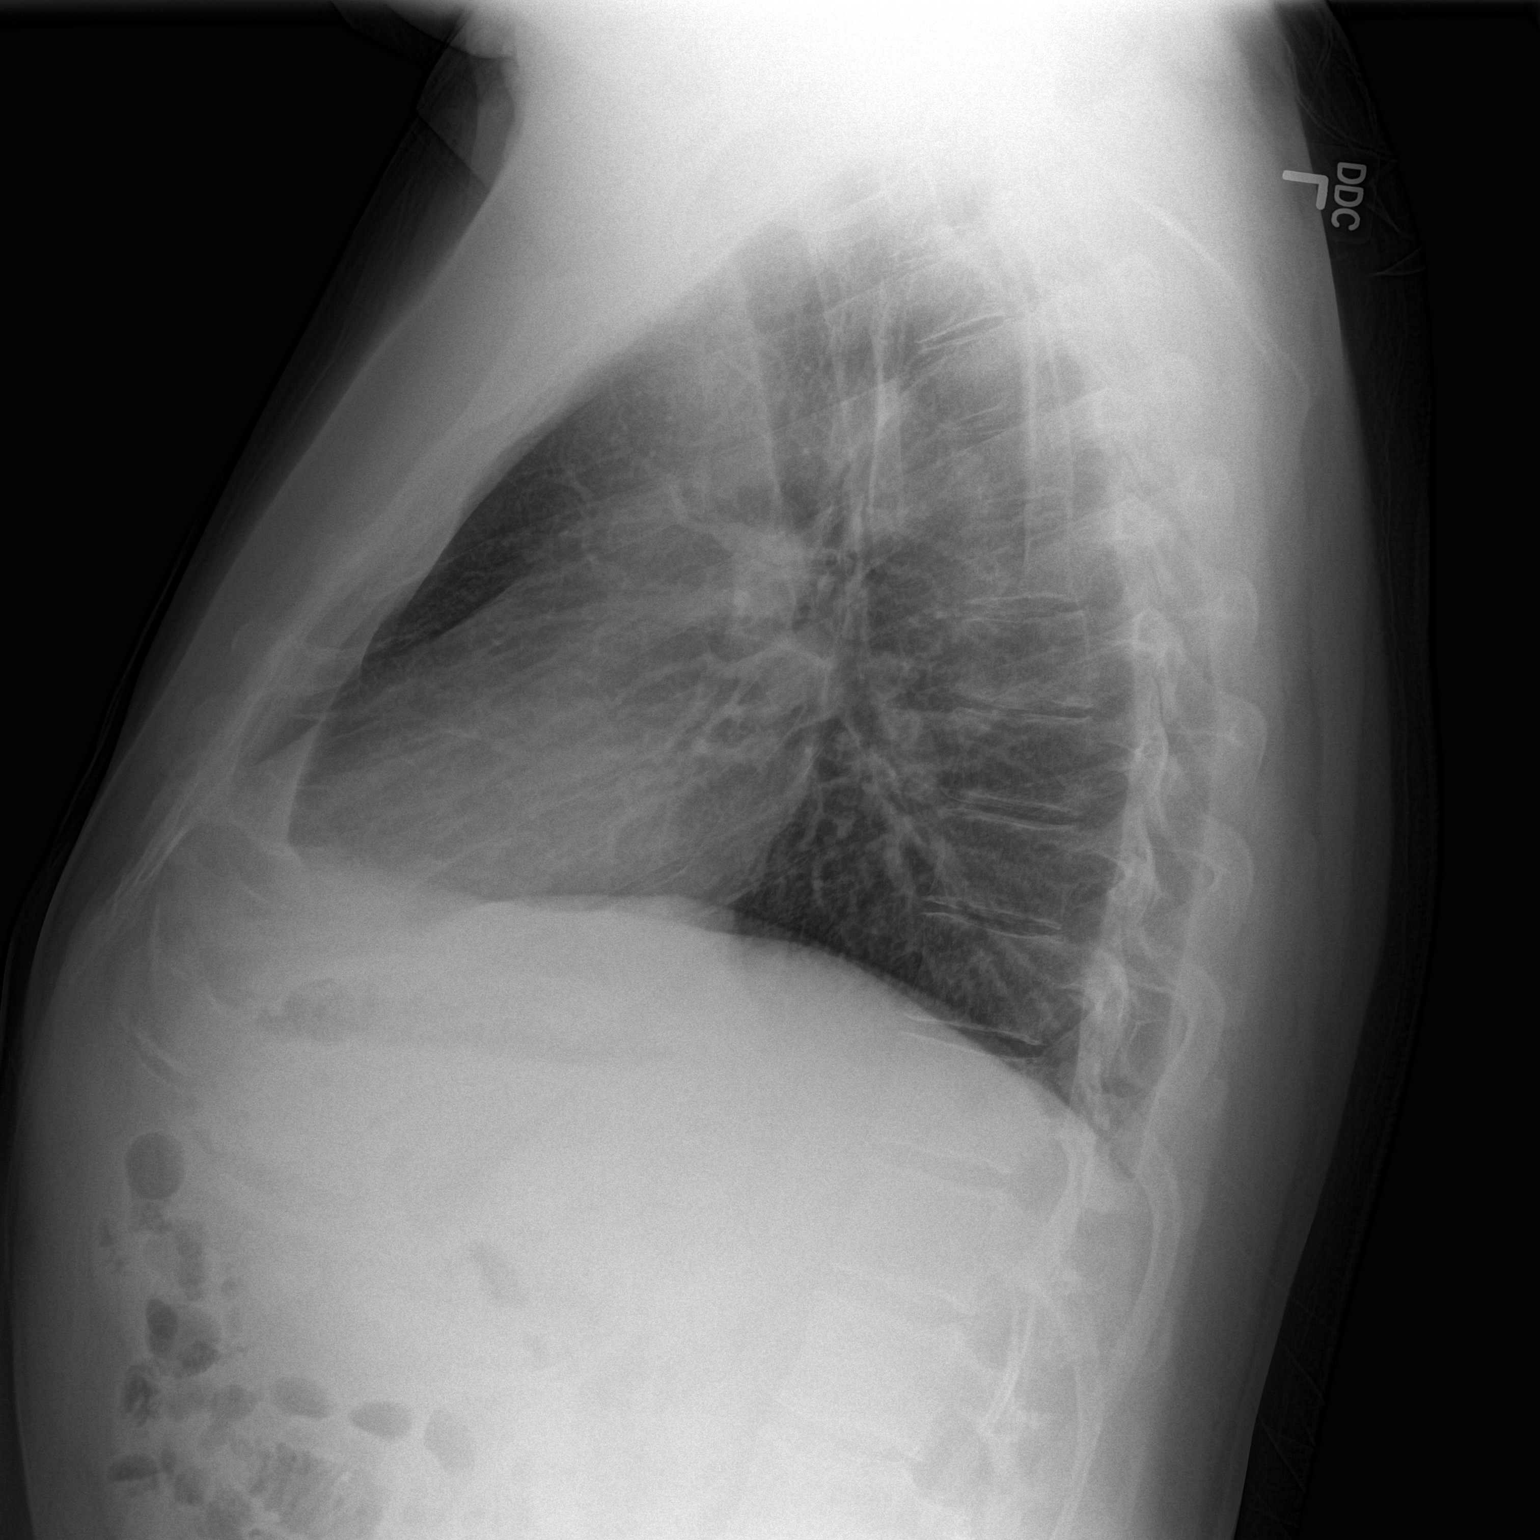

[2 of 2 positions shown; findings below may reference images not displayed]

FINDINGS: The heart size and mediastinal contours are within normal limits.
Both lungs are clear. No pneumothorax or pleural effusion is noted.
The visualized skeletal structures are unremarkable.
IMPRESSION: No active cardiopulmonary disease.

## 2017-04-21 ENCOUNTER — Other Ambulatory Visit: Payer: BLUE CROSS/BLUE SHIELD

## 2018-01-25 ENCOUNTER — Encounter: Payer: Self-pay | Admitting: Family Medicine

## 2018-01-25 ENCOUNTER — Ambulatory Visit: Payer: BLUE CROSS/BLUE SHIELD | Admitting: Family Medicine

## 2018-01-25 VITALS — BP 122/78 | HR 70 | Temp 98.3°F | Ht 71.0 in | Wt 222.0 lb

## 2018-01-25 DIAGNOSIS — E039 Hypothyroidism, unspecified: Secondary | ICD-10-CM

## 2018-01-25 DIAGNOSIS — B356 Tinea cruris: Secondary | ICD-10-CM

## 2018-01-25 DIAGNOSIS — E669 Obesity, unspecified: Secondary | ICD-10-CM

## 2018-01-25 MED ORDER — PANTOPRAZOLE SODIUM 40 MG PO TBEC: 40.0000 mg | DELAYED_RELEASE_TABLET | Freq: Every day | ORAL | 1 refills | Status: DC

## 2018-01-25 MED ORDER — TERBINAFINE HCL 250 MG PO TABS: 250.0000 mg | ORAL_TABLET | Freq: Every day | ORAL | 0 refills | Status: DC

## 2018-01-25 NOTE — Progress Notes (Signed)
BP 122/78 (BP Location: Left Arm, Patient Position: Sitting, Cuff Size: Normal)   Pulse 70   Temp 98.3 F (36.8 C) (Oral)   Ht 5\' 11"  (1.803 m)   Wt 222 lb (100.7 kg)   SpO2 97%   BMI 30.96 kg/m    CC: rash Subjective:    Patient ID: Eric Colon, male    DOB: Nov 08, 1965, 52 y.o.   MRN: [id number]  HPI: ABDOULAYE Colon is a 52 y.o. male presenting on 01/25/2018 for Rash (Located on buttocks and lower abd.  Has been treated previously with Lamisil, very helpful. Usually flares in warmer weather. )   Previously treated for tinea cruris with lamisil with good benefit. Has been treating with apple cider vinegar and butenafine OTC antifungal. He has been sweating more, working in the yard. New itchy red rash to waist and buttock.   Denies fevers/chills.  No new lotions, detergents, soaps or shampoos.  No new medicines.   22 lb weight loss over last year after following ketogenic diet. Limits to 30-50 carbs/day.  Backed off energy drinks.   Relevant past medical, surgical, family and social history reviewed and updated as indicated. Interim medical history since our last visit reviewed. Allergies and medications reviewed and updated. Outpatient Medications Prior to Visit  Medication Sig Dispense Refill  . sildenafil (VIAGRA) 100 MG tablet Take 0.5-1 tablets (50-100 mg total) by mouth daily as needed for erectile dysfunction. 10 tablet 3  . pantoprazole (PROTONIX) 40 MG tablet Take 1 tablet (40 mg total) by mouth daily as needed. 30 tablet 1  . pantoprazole (PROTONIX) 40 MG tablet Take 40 mg by mouth daily. As needed    . cyclobenzaprine (FLEXERIL) 10 MG tablet Take 1 tablet (10 mg total) by mouth 2 (two) times daily as needed for muscle spasms (sedation precautions). 30 tablet 0   No facility-administered medications prior to visit.      Per HPI unless specifically indicated in ROS section below Review of Systems     Objective:    BP 122/78 (BP Location: Left Arm, Patient  Position: Sitting, Cuff Size: Normal)   Pulse 70   Temp 98.3 F (36.8 C) (Oral)   Ht 5\' 11"  (1.803 m)   Wt 222 lb (100.7 kg)   SpO2 97%   BMI 30.96 kg/m   Wt Readings from Last 3 Encounters:  01/25/18 222 lb (100.7 kg)  02/17/17 241 lb 12 oz (109.7 kg)  10/16/16 242 lb 4 oz (109.9 kg)    Physical Exam  Constitutional: He appears well-developed and well-nourished. No distress.  Skin: Skin is warm and dry. Rash noted. There is erythema.     Pruritic erythematous maculopapular rash with patches on buttocks, lower abdomen, and lower back, excoriations present  Nursing note and vitals reviewed.  Results for orders placed or performed in visit on 02/17/17  TSH  Result Value Ref Range   TSH 4.19 0.35 - 4.50 uIU/mL  T3  Result Value Ref Range   T3, Total 108.0 76 - 181 ng/dL  T4, free  Result Value Ref Range   Free T4 0.79 0.60 - 1.60 ng/dL      Assessment & Plan:   Problem List Items Addressed This Visit    Acquired hypothyroidism    Stable period off medications.       Obesity, Class I, BMI 30-34.9    Congratulated on weight loss to date. Reviewed keto diet, intermittent fasting, sustainable healthy diet and lifestyle changes.  Will  update baseline FLP when he returns fasting.       Tinea cruris - Primary    Recurrent. Rx lamisil 2wk course, with option to extend another 2 weeks if needed. Continue OTC topical butenafine as well. Update if not improved with treatment.       Relevant Medications   terbinafine (LAMISIL) 250 MG tablet       Meds ordered this encounter  Medications  . pantoprazole (PROTONIX) 40 MG tablet    Sig: Take 1 tablet (40 mg total) by mouth daily. As needed    Dispense:  30 tablet    Refill:  1  . terbinafine (LAMISIL) 250 MG tablet    Sig: Take 1 tablet (250 mg total) by mouth daily.    Dispense:  30 tablet    Refill:  0   No orders of the defined types were placed in this encounter.   Follow up plan: No follow-ups on  file.  Eric Bush, MD

## 2018-01-25 NOTE — Assessment & Plan Note (Addendum)
Congratulated on weight loss to date. Reviewed keto diet, intermittent fasting, sustainable healthy diet and lifestyle changes.  Will update baseline FLP when he returns fasting.

## 2018-01-25 NOTE — Assessment & Plan Note (Signed)
Recurrent. Rx lamisil 2wk course, with option to extend another 2 weeks if needed. Continue OTC topical butenafine as well. Update if not improved with treatment.

## 2018-01-25 NOTE — Patient Instructions (Signed)
Congratulations on weight loss! Continue OTC topical antifungal as well as lamisil 250mg  once daily for 2 weeks. If needed, may do another 2 weeks after this. Update [place] with effect.

## 2018-01-25 NOTE — Assessment & Plan Note (Signed)
Stable period off medications.

## 2018-01-26 ENCOUNTER — Ambulatory Visit: Payer: BLUE CROSS/BLUE SHIELD | Admitting: Family Medicine

## 2018-01-27 ENCOUNTER — Ambulatory Visit: Payer: BLUE CROSS/BLUE SHIELD | Admitting: Family Medicine

## 2018-05-31 ENCOUNTER — Other Ambulatory Visit (INDEPENDENT_AMBULATORY_CARE_PROVIDER_SITE_OTHER): Payer: BLUE CROSS/BLUE SHIELD

## 2018-05-31 DIAGNOSIS — K219 Gastro-esophageal reflux disease without esophagitis: Secondary | ICD-10-CM | POA: Diagnosis not present

## 2018-05-31 DIAGNOSIS — Z79899 Other long term (current) drug therapy: Secondary | ICD-10-CM | POA: Diagnosis not present

## 2018-05-31 DIAGNOSIS — B359 Dermatophytosis, unspecified: Secondary | ICD-10-CM | POA: Diagnosis not present

## 2018-05-31 LAB — HEPATIC FUNCTION PANEL
AG Ratio: 2.2 (calc) (ref 1.0–2.5)
ALT: 17 U/L (ref 9–46)
AST: 12 U/L (ref 10–35)
Albumin: 4.6 g/dL (ref 3.6–5.1)
Alkaline phosphatase (APISO): 58 U/L (ref 40–115)
Bilirubin, Direct: 0.1 mg/dL (ref 0.0–0.2)
Globulin: 2.1 g/dL (calc) (ref 1.9–3.7)
Indirect Bilirubin: 0.6 mg/dL (calc) (ref 0.2–1.2)
Total Bilirubin: 0.7 mg/dL (ref 0.2–1.2)
Total Protein: 6.7 g/dL (ref 6.1–8.1)

## 2018-08-02 DIAGNOSIS — B359 Dermatophytosis, unspecified: Secondary | ICD-10-CM | POA: Diagnosis not present

## 2018-08-31 DIAGNOSIS — Z79899 Other long term (current) drug therapy: Secondary | ICD-10-CM | POA: Diagnosis not present

## 2018-08-31 DIAGNOSIS — B360 Pityriasis versicolor: Secondary | ICD-10-CM | POA: Diagnosis not present

## 2018-08-31 DIAGNOSIS — B359 Dermatophytosis, unspecified: Secondary | ICD-10-CM | POA: Diagnosis not present

## 2019-04-05 DIAGNOSIS — Z20828 Contact with and (suspected) exposure to other viral communicable diseases: Secondary | ICD-10-CM | POA: Diagnosis not present

## 2019-05-10 ENCOUNTER — Encounter: Payer: Self-pay | Admitting: Family Medicine

## 2019-05-10 ENCOUNTER — Other Ambulatory Visit: Payer: Self-pay

## 2019-05-10 ENCOUNTER — Ambulatory Visit: Payer: BC Managed Care – PPO | Admitting: Family Medicine

## 2019-05-10 VITALS — BP 112/78 | HR 71 | Temp 97.7°F | Ht 71.0 in | Wt 249.0 lb

## 2019-05-10 DIAGNOSIS — E039 Hypothyroidism, unspecified: Secondary | ICD-10-CM | POA: Diagnosis not present

## 2019-05-10 DIAGNOSIS — E669 Obesity, unspecified: Secondary | ICD-10-CM | POA: Diagnosis not present

## 2019-05-10 DIAGNOSIS — K219 Gastro-esophageal reflux disease without esophagitis: Secondary | ICD-10-CM | POA: Diagnosis not present

## 2019-05-10 DIAGNOSIS — N529 Male erectile dysfunction, unspecified: Secondary | ICD-10-CM

## 2019-05-10 DIAGNOSIS — B356 Tinea cruris: Secondary | ICD-10-CM

## 2019-05-10 MED ORDER — PANTOPRAZOLE SODIUM 40 MG PO TBEC: 40.0000 mg | DELAYED_RELEASE_TABLET | Freq: Every day | ORAL | 6 refills | Status: DC

## 2019-05-10 MED ORDER — PHENTERMINE HCL 30 MG PO CAPS: 30.0000 mg | ORAL_CAPSULE | ORAL | 0 refills | Status: DC

## 2019-05-10 MED ORDER — TERBINAFINE HCL 250 MG PO TABS: 250.0000 mg | ORAL_TABLET | Freq: Every day | ORAL | 0 refills | Status: DC

## 2019-05-10 MED ORDER — SILDENAFIL CITRATE 20 MG PO TABS: 40.0000 mg | ORAL_TABLET | Freq: Every day | ORAL | 3 refills | Status: DC | PRN

## 2019-05-10 NOTE — Assessment & Plan Note (Signed)
Weight gain noted in setting of pandemic, decreased activity, unhealthy food choices. Iinterested in medication to help start weight loss. Will Rx phentermine 30mg  daily. Reviewed mechanism of action as well as side effects to monitor for. RTC 1 mo f/u visit weight.

## 2019-05-10 NOTE — Assessment & Plan Note (Signed)
Recurrent. Lamisil has helped in the past  - refill today. Has seen derm for this, prescribed some shampoo with limited benefit.

## 2019-05-10 NOTE — Assessment & Plan Note (Addendum)
Recently found to have elevated TSH - will need this rechecked at CPE labs next month. Could contribute to weight gain.

## 2019-05-10 NOTE — Assessment & Plan Note (Signed)
Trial generic sildenafil 20mg . Reviewed how this is not FDA approved for ED at this dose, will need to pay out of pocket but may be cheaper than viagra cost.

## 2019-05-10 NOTE — Progress Notes (Signed)
This visit was conducted in person.  BP 112/78 (BP Location: Left Arm, Patient Position: Sitting, Cuff Size: Large)   Pulse 71   Temp 97.7 F (36.5 C) (Temporal)   Ht 5\' 11"  (1.803 m)   Wt 249 lb (112.9 kg)   SpO2 95%   BMI 34.73 kg/m    CC: discuss GERD and other concerns Subjective:    Patient ID: Eric Colon, male    DOB: 1966/01/16, 53 y.o.   MRN: [id number]  HPI: Eric Colon is a 53 y.o. male presenting on 05/10/2019 for Gastroesophageal Colon (C/o more frequent episodes of Colon. )   GERD - progressively worse over the last few weeks. Increased coffee and energy drinks. Up to 40 oz caffeinated beverages per day. Has had benefit with pantoprazole, as well as rolaids. No dysphagia, unexpected weight loss, nausea/vomiting or early satiety.   Weight gain noted recently - has been eating out a lot recently in pandemic.   ED - treating with generic sildenafil with benefit. #8 tablets cost $20.   Requests lamisil refilled for buttock fungal rash - has seen [facility] treated with shampoo with limited benefit. Has tried antifungal cream (thinks clotrimazole) with some benefit.      Relevant past medical, surgical, family and social history reviewed and updated as indicated. Interim medical history since our last visit reviewed. Allergies and medications reviewed and updated. Outpatient Medications Prior to Visit  Medication Sig Dispense Refill  . sildenafil (VIAGRA) 100 MG tablet Take 0.5-1 tablets (50-100 mg total) by mouth daily as needed for erectile dysfunction. 10 tablet 3  . pantoprazole (PROTONIX) 40 MG tablet Take 1 tablet (40 mg total) by mouth daily. As needed 30 tablet 1  . terbinafine (LAMISIL) 250 MG tablet Take 1 tablet (250 mg total) by mouth daily. 30 tablet 0   No facility-administered medications prior to visit.      Per HPI unless specifically indicated in ROS section below Review of Systems Objective:    BP 112/78 (BP Location: Left Arm,  Patient Position: Sitting, Cuff Size: Large)   Pulse 71   Temp 97.7 F (36.5 C) (Temporal)   Ht 5\' 11"  (1.803 m)   Wt 249 lb (112.9 kg)   SpO2 95%   BMI 34.73 kg/m   Wt Readings from Last 3 Encounters:  05/10/19 249 lb (112.9 kg)  01/25/18 222 lb (100.7 kg)  02/17/17 241 lb 12 oz (109.7 kg)    Physical Exam Vitals signs and nursing note reviewed.  Constitutional:      General: He is not in acute distress.    Appearance: Normal appearance. He is not ill-appearing.  HENT:     Head: Normocephalic and atraumatic.     Mouth/Throat:     Mouth: Mucous membranes are moist.     Pharynx: No posterior oropharyngeal erythema.  Eyes:     Extraocular Movements: Extraocular movements intact.     Pupils: Pupils are equal, round, and reactive to light.  Cardiovascular:     Rate and Rhythm: Normal rate and regular rhythm.     Pulses: Normal pulses.     Heart sounds: Normal heart sounds. No murmur.  Pulmonary:     Effort: Pulmonary effort is normal. No respiratory distress.     Breath sounds: Normal breath sounds. No wheezing, rhonchi or rales.  Skin:    General: Skin is warm and dry.     Findings: Erythema and rash present.     Comments: Pruritic erythematous maculopapular  rash on R>L buttocks as well as inguinal region   Neurological:     Mental Status: He is alert.  Psychiatric:        Mood and Affect: Mood normal.        Behavior: Behavior normal.       Results for orders placed or performed in visit on 05/31/18  Hepatic function panel  Result Value Ref Range   Total Protein 6.7 6.1 - 8.1 g/dL   Albumin 4.6 3.6 - 5.1 g/dL   Globulin 2.1 1.9 - 3.7 g/dL (calc)   AG Ratio 2.2 1.0 - 2.5 (calc)   Total Bilirubin 0.7 0.2 - 1.2 mg/dL   Bilirubin, Direct 0.1 0.0 - 0.2 mg/dL   Indirect Bilirubin 0.6 0.2 - 1.2 mg/dL (calc)   Alkaline phosphatase (APISO) 58 40 - 115 U/L   AST 12 10 - 35 U/L   ALT 17 9 - 46 U/L   Lab Results  Component Value Date   CREATININE 1.39 10/16/2016   BUN  17 10/16/2016   NA 140 10/16/2016   K 3.9 10/16/2016   CL 105 10/16/2016   CO2 32 10/16/2016    Assessment & Plan:  Patient will schedule physical at his convenience.  Problem List Items Addressed This Visit    Tinea cruris    Recurrent. Lamisil has helped in the past  - refill today. Has seen derm for this, prescribed some shampoo with limited benefit.       Relevant Medications   terbinafine (LAMISIL) 250 MG tablet   Obesity, Class I, BMI 30-34.9    Weight gain noted in setting of pandemic, decreased activity, unhealthy food choices. Iinterested in medication to help start weight loss. Will Rx phentermine 30mg  daily. Reviewed mechanism of action as well as side effects to monitor for. RTC 1 mo f/u visit weight.       GERD (gastroesophageal Colon disease)    Deteriorated with weight gain and high caffeinated beverage intake. Reviewed dietary precautions. Rx protonix 40mg  QD x 2 wks then PRN. No red flags. Update if not effective control with this.       Relevant Medications   pantoprazole (PROTONIX) 40 MG tablet   Erectile dysfunction    Trial generic sildenafil 20mg . Reviewed how this is not FDA approved for ED at this dose, will need to pay out of pocket but may be cheaper than viagra cost.       Acquired hypothyroidism    Recently found to have elevated TSH - will need this rechecked at [facility] labs next month. Could contribute to weight gain.           Meds ordered this encounter  Medications  . terbinafine (LAMISIL) 250 MG tablet    Sig: Take 1 tablet (250 mg total) by mouth daily.    Dispense:  30 tablet    Refill:  0  . pantoprazole (PROTONIX) 40 MG tablet    Sig: Take 1 tablet (40 mg total) by mouth daily. As needed    Dispense:  30 tablet    Refill:  6  . sildenafil (REVATIO) 20 MG tablet    Sig: Take 2-3 tablets (40-60 mg total) by mouth daily as needed (relations).    Dispense:  30 tablet    Refill:  3  . phentermine 30 MG capsule    Sig: Take 1 capsule (30  mg total) by mouth every morning.    Dispense:  30 capsule    Refill:  0  No orders of the defined types were placed in this encounter.   Follow up plan: Return in about 4 weeks (around 06/07/2019), or if symptoms worsen or fail to improve, for annual exam, prior fasting for blood work.  Ria Bush, MD

## 2019-05-10 NOTE — Patient Instructions (Addendum)
Trial generic viagra (sildenafil 20mg ) sent to pharmacy. This will be out of pocket.  Pantoprazole refilled as well as lamisil cream.  GERD precautions:  Head of bed elevated. Avoidance of citrus, fatty foods, chocolate, peppermint, and excessive alcohol, along with sodas, caffeine, orange juice (acidic drinks).  At least a few hours between dinner and bed, minimize naps after eating.  Trial phentermine 30mg  daily for 1 month. Return in 1 month for physical.

## 2019-05-10 NOTE — Assessment & Plan Note (Signed)
Deteriorated with weight gain and high caffeinated beverage intake. Reviewed dietary precautions. Rx protonix 40mg  QD x 2 wks then PRN. No red flags. Update if not effective control with this.

## 2019-09-30 ENCOUNTER — Ambulatory Visit: Payer: BC Managed Care – PPO | Attending: Internal Medicine

## 2019-09-30 ENCOUNTER — Other Ambulatory Visit: Payer: Self-pay

## 2019-09-30 DIAGNOSIS — Z20822 Contact with and (suspected) exposure to covid-19: Secondary | ICD-10-CM

## 2019-09-30 DIAGNOSIS — Z20828 Contact with and (suspected) exposure to other viral communicable diseases: Secondary | ICD-10-CM | POA: Diagnosis not present

## 2019-10-02 LAB — NOVEL CORONAVIRUS, NAA: SARS-CoV-2, NAA: NOT DETECTED

## 2019-12-08 ENCOUNTER — Ambulatory Visit: Payer: BC Managed Care – PPO | Attending: Internal Medicine

## 2019-12-08 DIAGNOSIS — Z20822 Contact with and (suspected) exposure to covid-19: Secondary | ICD-10-CM | POA: Diagnosis not present

## 2019-12-09 LAB — NOVEL CORONAVIRUS, NAA: SARS-CoV-2, NAA: NOT DETECTED

## 2019-12-09 LAB — SPECIMEN STATUS REPORT

## 2020-02-07 DIAGNOSIS — M9901 Segmental and somatic dysfunction of cervical region: Secondary | ICD-10-CM | POA: Diagnosis not present

## 2020-02-07 DIAGNOSIS — M9902 Segmental and somatic dysfunction of thoracic region: Secondary | ICD-10-CM | POA: Diagnosis not present

## 2020-02-07 DIAGNOSIS — M47814 Spondylosis without myelopathy or radiculopathy, thoracic region: Secondary | ICD-10-CM | POA: Diagnosis not present

## 2020-02-07 DIAGNOSIS — M50323 Other cervical disc degeneration at C6-C7 level: Secondary | ICD-10-CM | POA: Diagnosis not present

## 2020-02-14 DIAGNOSIS — M9902 Segmental and somatic dysfunction of thoracic region: Secondary | ICD-10-CM | POA: Diagnosis not present

## 2020-02-14 DIAGNOSIS — M47814 Spondylosis without myelopathy or radiculopathy, thoracic region: Secondary | ICD-10-CM | POA: Diagnosis not present

## 2020-02-14 DIAGNOSIS — M50323 Other cervical disc degeneration at C6-C7 level: Secondary | ICD-10-CM | POA: Diagnosis not present

## 2020-02-14 DIAGNOSIS — M9901 Segmental and somatic dysfunction of cervical region: Secondary | ICD-10-CM | POA: Diagnosis not present

## 2020-06-18 ENCOUNTER — Other Ambulatory Visit: Payer: Self-pay

## 2020-06-18 ENCOUNTER — Emergency Department (HOSPITAL_COMMUNITY)
Admission: EM | Admit: 2020-06-18 | Discharge: 2020-06-19 | Disposition: A | Discharge status: Home / Self Care | Payer: BC Managed Care – PPO | Attending: Emergency Medicine | Admitting: Emergency Medicine

## 2020-06-18 ENCOUNTER — Emergency Department (HOSPITAL_COMMUNITY): Payer: BC Managed Care – PPO

## 2020-06-18 ENCOUNTER — Encounter (HOSPITAL_COMMUNITY): Payer: Self-pay

## 2020-06-18 DIAGNOSIS — J90 Pleural effusion, not elsewhere classified: Secondary | ICD-10-CM | POA: Diagnosis not present

## 2020-06-18 DIAGNOSIS — R0602 Shortness of breath: Secondary | ICD-10-CM | POA: Diagnosis not present

## 2020-06-18 DIAGNOSIS — R197 Diarrhea, unspecified: Secondary | ICD-10-CM | POA: Insufficient documentation

## 2020-06-18 DIAGNOSIS — E039 Hypothyroidism, unspecified: Secondary | ICD-10-CM | POA: Diagnosis not present

## 2020-06-18 DIAGNOSIS — R05 Cough: Secondary | ICD-10-CM | POA: Diagnosis not present

## 2020-06-18 DIAGNOSIS — U071 COVID-19: Secondary | ICD-10-CM | POA: Diagnosis not present

## 2020-06-18 LAB — BASIC METABOLIC PANEL
Anion gap: 10 (ref 5–15)
BUN: 19 mg/dL (ref 6–20)
CO2: 26 mmol/L (ref 22–32)
Calcium: 8.5 mg/dL — ABNORMAL LOW (ref 8.9–10.3)
Chloride: 103 mmol/L (ref 98–111)
Creatinine, Ser: 1.33 mg/dL — ABNORMAL HIGH (ref 0.61–1.24)
GFR calc Af Amer: >60 mL/min (ref >60)
GFR calc non Af Amer: >60 mL/min (ref >60)
Glucose, Bld: 114 mg/dL — ABNORMAL HIGH (ref 70–99)
Potassium: 3.8 mmol/L (ref 3.5–5.1)
Sodium: 139 mmol/L (ref 135–145)

## 2020-06-18 LAB — CBC
HCT: 43.3 % (ref 39.0–52.0)
Hemoglobin: 14.1 g/dL (ref 13.0–17.0)
MCH: 28.5 pg (ref 26.0–34.0)
MCHC: 32.6 g/dL (ref 30.0–36.0)
MCV: 87.5 fL (ref 80.0–100.0)
Platelets: 135 10*3/uL — ABNORMAL LOW (ref 150–400)
RBC: 4.95 MIL/uL (ref 4.22–5.81)
RDW: 12.5 % (ref 11.5–15.5)
WBC: 3.1 10*3/uL — ABNORMAL LOW (ref 4.0–10.5)
nRBC: 0 % (ref 0.0–0.2)

## 2020-06-18 LAB — TROPONIN I (HIGH SENSITIVITY): Troponin I (High Sensitivity): 5 ng/L (ref <18)

## 2020-06-18 MED ORDER — AEROCHAMBER PLUS FLO-VU LARGE MISC: 1.0000 | Freq: Once | Status: DC

## 2020-06-18 MED ORDER — ALBUTEROL SULFATE HFA 108 (90 BASE) MCG/ACT IN AERS
2.0000 | INHALATION_SPRAY | Freq: Once | RESPIRATORY_TRACT | Status: AC
  Administered 2020-06-19: 2 via RESPIRATORY_TRACT
  Filled 2020-06-18: qty 6.7

## 2020-06-18 MED ORDER — ACETAMINOPHEN 325 MG PO TABS
650.0000 mg | ORAL_TABLET | Freq: Once | ORAL | Status: AC
  Administered 2020-06-19: 650 mg via ORAL
  Filled 2020-06-18: qty 2

## 2020-06-18 NOTE — ED Provider Notes (Signed)
[facility] Provider Note   CSN: [id number] Arrival date & time: 06/18/20  1957     History Chief Complaint  Patient presents with  . COVID    Eric Colon is a 54 y.o. male with a history of migraines, GERD, anxiety, & hypothyroidism who presents to the ED with complaints of feeling generally poorly in the setting of testing positive for COVID 19. Patient states he has not felt well with sxs including fever, chills, generalized body aches, fatigue, congestion, cough productive of phlegm sputum which at times has blood present, dyspnea, & chest pain. Chest pain is central, constant, worse with a deep breath, no alleviating factors. Also notes some diarrhea and poor PO intake. Sxs seem to be getting worse instead of better prompting ED visit.  He did not receive his COVID vaccine. Denies vomiting, abdominal pain, unilateral leg pain/swelling, recent surgery/trauma, recent long travel, hormone use, personal hx of cancer, or hx of DVT/PE. Tested positive for covid 06/11/20.      HPI     Past Medical History:  Diagnosis Date  . GERD (gastroesophageal reflux disease)   . Hemorrhoids   . Migraines     Patient Active Problem List   Diagnosis Date Noted  . Acquired hypothyroidism 02/17/2017  . Adhesive capsulitis of knee, right 02/17/2017  . Erectile dysfunction 02/17/2017  . Health maintenance examination 09/18/2015  . GAD (generalized anxiety disorder) 03/02/2015  . Renal insufficiency 01/07/2014  . Seasonal allergic rhinitis 01/07/2014  . Obesity, Class I, BMI 30-34.9 10/31/2013  . Tinea cruris 10/31/2013  . GERD (gastroesophageal reflux disease)   . Migraines   . Hemorrhoids     Past Surgical History:  Procedure Laterality Date  . INGUINAL HERNIA REPAIR Right 1990       Family History  Problem Relation Age of Onset  . Cancer Mother 12       lung (smoker)  . Cancer Father 22       prostate?  . Diabetes Maternal Grandmother    . CAD Neg Hx   . Stroke Neg Hx     Social History   Tobacco Use  . Smoking status: Never Smoker  . Smokeless tobacco: Never Used  Substance Use Topics  . Alcohol use: Yes    Alcohol/week: 0.0 standard drinks    Comment: 2 beers/week  . Drug use: No    Home Medications Prior to Admission medications   Medication Sig Start Date End Date Taking? Authorizing Provider  pantoprazole (PROTONIX) 40 MG tablet Take 1 tablet (40 mg total) by mouth daily. As needed 05/10/19   Ria Bush, MD  phentermine 30 MG capsule Take 1 capsule (30 mg total) by mouth every morning. 05/10/19   Ria Bush, MD  sildenafil (REVATIO) 20 MG tablet Take 2-3 tablets (40-60 mg total) by mouth daily as needed (relations). 05/10/19   Ria Bush, MD  sildenafil (VIAGRA) 100 MG tablet Take 0.5-1 tablets (50-100 mg total) by mouth daily as needed for erectile dysfunction. 02/19/17   Ria Bush, MD  terbinafine (LAMISIL) 250 MG tablet Take 1 tablet (250 mg total) by mouth daily. 05/10/19   Ria Bush, MD    Allergies    Patient has no known allergies.  Review of Systems   Review of Systems  Constitutional: Positive for appetite change, chills, fatigue and fever.  HENT: Positive for congestion. Negative for ear pain and sore throat.   Respiratory: Positive for cough and shortness of breath.   Cardiovascular:  Positive for chest pain. Negative for leg swelling.  Gastrointestinal: Positive for diarrhea. Negative for abdominal pain, anal bleeding, blood in stool and vomiting.  Musculoskeletal: Positive for myalgias (generalized).  All other systems reviewed and are negative.   Physical Exam Updated Vital Signs BP 112/71 (BP Location: Left Arm)   Pulse 89   Temp 98.7 F (37.1 C) (Oral)   Resp 20   SpO2 96%   Physical Exam Vitals and nursing note reviewed.  Constitutional:      General: He is not in acute distress.    Appearance: He is well-developed. He is not toxic-appearing.    HENT:     Head: Normocephalic and atraumatic.  Eyes:     General:        Right eye: No discharge.        Left eye: No discharge.     Conjunctiva/sclera: Conjunctivae normal.  Cardiovascular:     Rate and Rhythm: Normal rate and regular rhythm.  Pulmonary:     Effort: Pulmonary effort is normal. No respiratory distress.     Breath sounds: Normal breath sounds. No wheezing, rhonchi or rales.  Chest:     Chest wall: No tenderness.  Abdominal:     General: There is no distension.     Palpations: Abdomen is soft.     Tenderness: There is no abdominal tenderness. There is no guarding or rebound.  Musculoskeletal:     Cervical back: Neck supple.     Right lower leg: No edema.     Left lower leg: No edema.  Skin:    General: Skin is warm and dry.     Findings: No rash.  Neurological:     Mental Status: He is alert.     Comments: Clear speech.   Psychiatric:        Behavior: Behavior normal.    ED Results / Procedures / Treatments   Labs (all labs ordered are listed, but only abnormal results are displayed) Labs Reviewed  BASIC METABOLIC PANEL - Abnormal; Notable for the following components:      Result Value   Glucose, Bld 114 (*)    Creatinine, Ser 1.33 (*)    Calcium 8.5 (*)    All other components within normal limits  CBC - Abnormal; Notable for the following components:   WBC 3.1 (*)    Platelets 135 (*)    All other components within normal limits  TROPONIN I (HIGH SENSITIVITY)  TROPONIN I (HIGH SENSITIVITY)    EKG None  Radiology DG Chest Portable 1 View  Result Date: 06/18/2020 CLINICAL DATA:  Shortness of breath.  Cough. EXAM: PORTABLE CHEST 1 VIEW COMPARISON:  None. FINDINGS: Upper normal heart size. Normal mediastinal contours. Patchy heterogeneous bilateral airspace opacities in a mid-lower lung zone predominant distribution. Possible small right pleural effusion. No pneumothorax or evidence of pneumomediastinum. No acute osseous abnormalities are seen.  IMPRESSION: Patchy heterogeneous bilateral airspace opacities, suspicious for multifocal pneumonia, pattern suspicious for COVID-19. Possible small right pleural effusion. Electronically Signed   By: Keith Rake M.D.   On: 06/18/2020 22:37    Procedures Procedures (including critical care time)  Medications Ordered in ED Medications  AeroChamber Plus Flo-Vu Large MISC 1 each (1 each Other Not Given 06/19/20 0433)  0.9 %  sodium chloride infusion (has no administration in time range)  diphenhydrAMINE (BENADRYL) injection 50 mg (has no administration in time range)  famotidine (PEPCID) IVPB 20 mg premix (has no administration in time range)  methylPREDNISolone  sodium succinate (SOLU-MEDROL) 125 mg/2 mL injection 125 mg (has no administration in time range)  albuterol (VENTOLIN HFA) 108 (90 Base) MCG/ACT inhaler 2 puff (has no administration in time range)  EPINEPHrine (EPI-PEN) injection 0.3 mg (has no administration in time range)  albuterol (VENTOLIN HFA) 108 (90 Base) MCG/ACT inhaler 2 puff (2 puffs Inhalation Given 06/19/20 0032)  acetaminophen (TYLENOL) tablet 650 mg (650 mg Oral Given 06/19/20 0033)  casirivimab-imdevimab (REGEN-COV) 1,200 mg in sodium chloride 0.9 % 110 mL IVPB (0 mg Intravenous Stopped 06/19/20 0519)  iohexol (OMNIPAQUE) 350 MG/ML injection 75 mL (75 mLs Intravenous Contrast Given 06/19/20 0556)    ED Course  I have reviewed the triage vital signs and the nursing notes.  Pertinent labs & imaging results that were available during my care of the patient were reviewed by me and considered in my medical decision making (see chart for details).   Eric Colon was evaluated in Emergency Department on 06/19/2020 for the symptoms described in the history of present illness. He/she was evaluated in the context of the global COVID-19 pandemic, which necessitated consideration that the patient might be at risk for infection with the SARS-CoV-2 virus that causes COVID-19.  Institutional protocols and algorithms that pertain to the evaluation of patients at risk for COVID-19 are in a state of rapid change based on information released by regulatory bodies including the CDC and federal and state organizations. These policies and algorithms were followed during the patient's care in the ED.     MDM Rules/Calculators/A&P                          Patient presents to the ED with complaints of feeling progressively worse in the setting of testing positive for COVID 19 on 06/11/20. Exam fairly benign. Does not appear in respiratory distress, SpO2 94% on RA @ rest, ambulated patient and he maintained SpO2 @ 95% on RA.   Additional history obtained:  Additional history obtained from chart & nursing note review.  EKG: No STEMI Lab Tests:  I reviewed and interpreted labs, which included:  CBC: leukopenia & thrombocytopenia- likely due to covid 19 BMP: Elevated renal function similar to prior. No significant electrolyte derangement.  Lipase: WNL Hepatic function panel: mild changes as above.   Imaging Studies ordered:  I ordered imaging studies which included CXR & subsequently CTA of the chest, I independently visualized and interpreted imaging which showed multifocal pneumonia w/ pattern suspicious for COVID 19, CTA similar- no PE.   CTA negative for PE. Patient remains without respiratory distress. He is able to ambulate & maintain SpO2 >94% on RA, does not appear to require admission for respiratory support at this time. Received MAB tx in the ED. Will discharge home with supportive care. I discussed results, treatment plan, need for follow-up, and return precautions with the patient. Provided opportunity for questions, patient confirmed understanding and is in agreement with plan.   Findings and plan of care discussed with supervising physician Dr. Wyvonnia Dusky who is in agreement.   Portions of this note were generated with [employer]. Dictation errors  may occur despite best attempts at proofreading.  Final Clinical Impression(s) / ED Diagnoses Final diagnoses:  [id number]    Rx / DC Orders ED Discharge Orders         Ordered    benzonatate (TESSALON) 100 MG capsule  Every 8 hours        06/19/20 0515  ondansetron (ZOFRAN ODT) 4 MG disintegrating tablet  Every 8 hours PRN        06/19/20 0515           Amaryllis Dyke, PA-C 06/19/20 0388    Ezequiel Essex, MD 06/19/20 [phone]

## 2020-06-18 NOTE — ED Triage Notes (Signed)
Pt reports that he took at home covid test on the 8/30, last two days has been having diarrhea, body aches, cough and CP/SOB

## 2020-06-19 ENCOUNTER — Other Ambulatory Visit: Payer: Self-pay

## 2020-06-19 ENCOUNTER — Telehealth: Payer: Self-pay

## 2020-06-19 ENCOUNTER — Emergency Department (HOSPITAL_COMMUNITY): Payer: BC Managed Care – PPO

## 2020-06-19 DIAGNOSIS — R0602 Shortness of breath: Secondary | ICD-10-CM | POA: Diagnosis not present

## 2020-06-19 LAB — HEPATIC FUNCTION PANEL
ALT: 34 U/L (ref 0–44)
AST: 48 U/L — ABNORMAL HIGH (ref 15–41)
Albumin: 2.9 g/dL — ABNORMAL LOW (ref 3.5–5.0)
Alkaline Phosphatase: 44 U/L (ref 38–126)
Bilirubin, Direct: 0.4 mg/dL — ABNORMAL HIGH (ref 0.0–0.2)
Indirect Bilirubin: 1.2 mg/dL — ABNORMAL HIGH (ref 0.3–0.9)
Total Bilirubin: 1.6 mg/dL — ABNORMAL HIGH (ref 0.3–1.2)
Total Protein: 5.6 g/dL — ABNORMAL LOW (ref 6.5–8.1)

## 2020-06-19 LAB — TROPONIN I (HIGH SENSITIVITY): Troponin I (High Sensitivity): 4 ng/L (ref <18)

## 2020-06-19 MED ORDER — ONDANSETRON 4 MG PO TBDP: 4.0000 mg | ORAL_TABLET | Freq: Three times a day (TID) | ORAL | 0 refills | Status: DC | PRN

## 2020-06-19 MED ORDER — ALBUTEROL SULFATE HFA 108 (90 BASE) MCG/ACT IN AERS: 2.0000 | INHALATION_SPRAY | Freq: Once | RESPIRATORY_TRACT | Status: DC | PRN

## 2020-06-19 MED ORDER — SODIUM CHLORIDE 0.9 % IV SOLN: INTRAVENOUS | Status: DC | PRN

## 2020-06-19 MED ORDER — DIPHENHYDRAMINE HCL 50 MG/ML IJ SOLN: 50.0000 mg | Freq: Once | INTRAMUSCULAR | Status: DC | PRN

## 2020-06-19 MED ORDER — METHYLPREDNISOLONE SODIUM SUCC 125 MG IJ SOLR: 125.0000 mg | Freq: Once | INTRAMUSCULAR | Status: DC | PRN

## 2020-06-19 MED ORDER — FAMOTIDINE IN NACL 20-0.9 MG/50ML-% IV SOLN: 20.0000 mg | Freq: Once | INTRAVENOUS | Status: DC | PRN

## 2020-06-19 MED ORDER — SODIUM CHLORIDE 0.9 % IV SOLN
1200.0000 mg | Freq: Once | INTRAVENOUS | Status: AC
  Administered 2020-06-19: 1200 mg via INTRAVENOUS
  Filled 2020-06-19: qty 10

## 2020-06-19 MED ORDER — IOHEXOL 350 MG/ML SOLN
75.0000 mL | Freq: Once | INTRAVENOUS | Status: AC | PRN
  Administered 2020-06-19: 75 mL via INTRAVENOUS

## 2020-06-19 MED ORDER — BENZONATATE 100 MG PO CAPS: 100.0000 mg | ORAL_CAPSULE | Freq: Three times a day (TID) | ORAL | 0 refills | Status: DC

## 2020-06-19 MED ORDER — EPINEPHRINE 0.3 MG/0.3ML IJ SOAJ: 0.3000 mg | Freq: Once | INTRAMUSCULAR | Status: DC | PRN

## 2020-06-19 NOTE — Telephone Encounter (Signed)
Seen at [facility] ER, note reviewed. Fortunately did receive mAb infusion while at ER.  Tested positive 06/11/2020 plz call for update on symptoms.  plz get first date of symptoms.

## 2020-06-19 NOTE — Discharge Instructions (Addendum)
You were seen in the emergency department today for worsening of your symptoms in the setting of COVID-19.  Your laboratory test showed that your white blood cell count and platelet counts are mildly low each of these things can happen with COVID-19, we recommend you get these labs rechecked within 1 to 2 weeks.  Your chest x-ray and CT imaging did not show signs of a blood clot, did show findings consistent with Covid.  Your heart enzyme testing and your EKG did not show signs of an acute heart attack.  You received a monoclonal antibody infusion in the emergency department and attempt to help with your COVID-19 symptoms.  We are sending you home with an albuterol inhaler to use 1 to 2 puffs every 4-6 hours as needed for shortness of breath/wheezing, Tessalon to take every hours as needed for cough, and Zofran to take every 8 hours as needed for nausea and vomiting.  We have prescribed you new medication(s) today. Discuss the medications prescribed today with your pharmacist as they can have adverse effects and interactions with your other medicines including over the counter and prescribed medications. Seek medical evaluation if you start to experience new or abnormal symptoms after taking one of these medicines, seek care immediately if you start to experience difficulty breathing, feeling of your throat closing, facial swelling, or rash as these could be indications of a more serious allergic reaction  We are instructing patient's with COVID 19 or symptoms of COVID 19 to quarantine themselves for 14 days. You may be able to discontinue self quarantine if the following conditions are met:   Persons with COVID-19 who have symptoms and were directed to care for themselves at home may discontinue home isolation under the  following conditions: - It has been at least 7 days have passed since symptoms first appeared. - AND at least 3 days (72 hours) have passed since recovery defined as resolution of fever  without the use of fever-reducing medications and improvement in respiratory symptoms (e.g., cough, shortness of breath)  Please follow the below quarantine instructions.   Please follow up with primary care within 3-5 days for re-evaluation- call prior to going to the office to make them aware of your symptoms as some offices are altering their method of seeing patients with COVID 19 symptoms. Return to the ER for new or worsening symptoms including but not limited to increased work of breathing, chest pain, passing out, or any other concerns.       Person Under Monitoring Name: Eric Colon [facility]  Location: [address] [place] [address]   Infection Prevention Recommendations for Individuals Confirmed to have, or Being Evaluated for, 2019 Novel Coronavirus (COVID-19) Infection Who Receive Care at Home  Individuals who are confirmed to have, or are being evaluated for, COVID-19 should follow the prevention steps below until a healthcare provider or local or state health department says they can return to normal activities.  Stay home except to get medical care You should restrict activities outside your home, except for getting medical care. Do not go to work, school, or public areas, and do not use public transportation or taxis.  Call ahead before visiting your doctor Before your medical appointment, call the healthcare provider and tell them that you have, or are being evaluated for, COVID-19 infection. This will help the healthcare provider's office take steps to keep other people from getting infected. Ask your healthcare provider to call the local or state health department.  Monitor  your symptoms Seek prompt medical attention if your illness is worsening (e.g., difficulty breathing). Before going to your medical appointment, call the healthcare provider and tell them that you have, or are being evaluated for, COVID-19 infection. Ask your healthcare provider to call  the local or state health department.  Wear a facemask You should wear a facemask that covers your nose and mouth when you are in the same room with other people and when you visit a healthcare provider. People who live with or visit you should also wear a facemask while they are in the same room with you.  Separate yourself from other people in your home As much as possible, you should stay in a different room from other people in your home. Also, you should use a separate bathroom, if available.  Avoid sharing household items You should not share dishes, drinking glasses, cups, eating utensils, towels, bedding, or other items with other people in your home. After using these items, you should wash them thoroughly with soap and water.  Cover your coughs and sneezes Cover your mouth and nose with a tissue when you cough or sneeze, or you can cough or sneeze into your sleeve. Throw used tissues in a lined trash can, and immediately wash your hands with soap and water for at least 20 seconds or use an alcohol-based hand rub.  Wash your [facility] your hands often and thoroughly with soap and water for at least 20 seconds. You can use an alcohol-based hand sanitizer if soap and water are not available and if your hands are not visibly dirty. Avoid touching your eyes, nose, and mouth with unwashed hands.   Prevention Steps for Caregivers and Household Members of Individuals Confirmed to have, or Being Evaluated for, COVID-19 Infection Being Cared for in the Home  If you live with, or provide care at home for, a person confirmed to have, or being evaluated for, COVID-19 infection please follow these guidelines to prevent infection:  Follow healthcare provider's instructions Make sure that you understand and can help the patient follow any healthcare provider instructions for all care.  Provide for the patient's basic needs You should help the patient with basic needs in the home and  provide support for getting groceries, prescriptions, and other personal needs.  Monitor the patient's symptoms If they are getting sicker, call his or her medical provider and tell them that the patient has, or is being evaluated for, COVID-19 infection. This will help the healthcare provider's office take steps to keep other people from getting infected. Ask the healthcare provider to call the local or state health department.  Limit the number of people who have contact with the patient If possible, have only one caregiver for the patient. Other household members should stay in another home or place of residence. If this is not possible, they should stay in another room, or be separated from the patient as much as possible. Use a separate bathroom, if available. Restrict visitors who do not have an essential need to be in the home.  Keep older adults, very young children, and other sick people away from the patient Keep older adults, very young children, and those who have compromised immune systems or chronic health conditions away from the patient. This includes people with chronic heart, lung, or kidney conditions, diabetes, and cancer.  Ensure good ventilation Make sure that shared spaces in the home have good air flow, such as from an air conditioner or an opened window, weather  permitting.  Wash your hands often Wash your hands often and thoroughly with soap and water for at least 20 seconds. You can use an alcohol based hand sanitizer if soap and water are not available and if your hands are not visibly dirty. Avoid touching your eyes, nose, and mouth with unwashed hands. Use disposable paper towels to dry your hands. If not available, use dedicated cloth towels and replace them when they become wet.  Wear a facemask and gloves Wear a disposable facemask at all times in the room and gloves when you touch or have contact with the patient's blood, body fluids, and/or secretions or  excretions, such as sweat, saliva, sputum, nasal mucus, vomit, urine, or feces.  Ensure the mask fits over your nose and mouth tightly, and do not touch it during use. Throw out disposable facemasks and gloves after using them. Do not reuse. Wash your hands immediately after removing your facemask and gloves. If your personal clothing becomes contaminated, carefully remove clothing and launder. Wash your hands after handling contaminated clothing. Place all used disposable facemasks, gloves, and other waste in a lined container before disposing them with other household waste. Remove gloves and wash your hands immediately after handling these items.  Do not share dishes, glasses, or other household items with the patient Avoid sharing household items. You should not share dishes, drinking glasses, cups, eating utensils, towels, bedding, or other items with a patient who is confirmed to have, or being evaluated for, COVID-19 infection. After the person uses these items, you should wash them thoroughly with soap and water.  Wash laundry thoroughly Immediately remove and wash clothes or bedding that have blood, body fluids, and/or secretions or excretions, such as sweat, saliva, sputum, nasal mucus, vomit, urine, or feces, on them. Wear gloves when handling laundry from the patient. Read and follow directions on labels of laundry or clothing items and detergent. In general, wash and dry with the warmest temperatures recommended on the label.  Clean all areas the individual has used often Clean all touchable surfaces, such as counters, tabletops, doorknobs, bathroom fixtures, toilets, phones, keyboards, tablets, and bedside tables, every day. Also, clean any surfaces that may have blood, body fluids, and/or secretions or excretions on them. Wear gloves when cleaning surfaces the patient has come in contact with. Use a diluted bleach solution (e.g., dilute bleach with 1 part bleach and 10 parts water)  or a household disinfectant with a label that says EPA-registered for coronaviruses. To make a bleach solution at home, add 1 tablespoon of bleach to 1 quart (4 cups) of water. For a larger supply, add  cup of bleach to 1 gallon (16 cups) of water. Read labels of cleaning products and follow recommendations provided on product labels. Labels contain instructions for safe and effective use of the cleaning product including precautions you should take when applying the product, such as wearing gloves or eye protection and making sure you have good ventilation during use of the product. Remove gloves and wash hands immediately after cleaning.  Monitor yourself for signs and symptoms of illness Caregivers and household members are considered close contacts, should monitor their health, and will be asked to limit movement outside of the home to the extent possible. Follow the monitoring steps for close contacts listed on the symptom monitoring form.   ? If you have additional questions, contact your local health department or call the epidemiologist on call at [phone] (available 24/7). ? This guidance is subject to change. For  the most up-to-date guidance from [facility], please refer to their website: [email or web address]

## 2020-06-19 NOTE — Telephone Encounter (Signed)
[place] Night - Client TELEPHONE ADVICE RECORD [facility] Patient Name: Eric Colon Gender: Male DOB: [birth year] Age: 54 Y 79 M 27 D Return Phone Number: [phone] (Primary) Address: City/State/Zip: [place] [address] Client [place] Night - Client Client Site [place] Physician Ria Bush - MD Contact Type Call Who Is Calling Patient / Member / Family / Caregiver Call Type Triage / Clinical Caller Name Deundra Bard Relationship To Patient Spouse Return Phone Number [phone] (Primary) Chief Complaint BREATHING - fast, heavy or wheezing Reason for Call Symptomatic / Request for [place] states her husband has had COVID for a week now and he is not getting any better, not able to eat, generalized weakness. Is there something that can be done. Still has coughing. Fever comes and goes. Additional Comment Pulse O2 is at 90 now. Translation No Nurse Assessment Nurse: Mancel Bale, RN, Butch Penny Date/Time Eilene Ghazi Time): 06/18/2020 9:18:30 AM Confirm and document reason for call. If symptomatic, describe symptoms. ---Caller states her husband was diagnosed with COVID 19 one week ago. He has cough, weakness, and intermittent fever. Unable to take temp. States symptoms are worsening. Has the patient had close contact with a person known or suspected to have the novel coronavirus illness OR traveled / lives in area with major community spread (including international travel) in the last 14 days from the onset of symptoms? * If Asymptomatic, screen for exposure and travel within the last 14 days. ---Yes Does the patient have any new or worsening symptoms? ---Yes Will a triage be completed? ---Yes Related visit to physician within the last 2 weeks? ---No Does the PT have any chronic conditions? (i.e. diabetes, asthma, this includes High risk factors for  pregnancy, etc.) ---No Is this a behavioral health or substance abuse call? ---No Guidelines Guideline Title Affirmed Question Affirmed Notes Nurse Date/Time ([place] Time) COVID-19 - Diagnosed or Suspected SEVERE or constant chest pain or pressure (Exception: mild central Sheran Fava 06/18/2020 9:19:55 AM PLEASE NOTE: All timestamps contained within this report are represented as [place] Standard Time. CONFIDENTIALTY NOTICE: This fax transmission is intended only for the addressee. It contains information that is legally privileged, confidential or otherwise protected from use or disclosure. If you are not the intended recipient, you are strictly prohibited from reviewing, disclosing, copying using or disseminating any of this information or taking any action in reliance on or regarding this information. If you have received this fax in error, please notify us immediately by telephone so that we can arrange for its return to [place]. Phone: [phone], Toll-Free: [phone], Fax: [phone] Page: 2 of 2 Call Id: [id number] Guidelines Guideline Title Affirmed Question Affirmed Notes Nurse Date/Time Eilene Ghazi Time) chest pain, present only when coughing) Disp. Time Eilene Ghazi Time) Disposition Final User 06/18/2020 9:16:34 AM Send to Urgent Queue Dineen Kid 06/18/2020 9:25:07 AM Go to ED Now Yes Mancel Bale, RN, [place] Disagree/Comply Comply Caller Understands Yes PreDisposition InappropriateToAsk Care Advice Given Per Guideline GO TO ED NOW: * You need to be seen in the Emergency Department. * Go to the ED at ___________ [place] now. [address] carefully. YOU SHOULD TELL HEALTHCARE PERSONNEL THAT YOU MIGHT HAVE COVID-19: * Tell the first healthcare worker you meet that you may have COVID-19. * Tell them you have symptoms and have been sent for COVID-19 testing. ANOTHER ADULT SHOULD DRIVE: * It is better and safer if another adult drives instead of you. CALL EMS  911 IF:  * Severe difficulty breathing occurs * Lips or face turns blue * Confusion occurs. Referrals [place] [facility] [place] [facility] [place]

## 2020-06-19 NOTE — ED Notes (Signed)
Patient refused for blood draw

## 2020-06-19 NOTE — Telephone Encounter (Signed)
Per chart review tab pt seen at [facility] ED on 06/18/20.

## 2020-06-19 NOTE — ED Notes (Signed)
This rn was attempting to get labs pt requesting to hold off on the blood work

## 2020-06-19 NOTE — ED Notes (Signed)
Patient verbalizes understanding of discharge instructions. Opportunity for questioning and answers were provided. Armband removed by staff, pt discharged from ED.  

## 2020-06-19 NOTE — ED Notes (Signed)
Pt transported to CT ?

## 2020-06-19 NOTE — Telephone Encounter (Addendum)
Lvm asking pt to call back.  Need to know date when sxs started, what were his sxs and what sxs he's still having.   Added pt to Call Log.

## 2020-06-20 NOTE — Telephone Encounter (Addendum)
mucinex helps break up mucous. If he feels congested but not getting much mucous up, ok to use mucinex with big glass of water.  If desires more cough suppressant, would recommend OTC cough syrup like delsym or dimetapp or robitussin. If these don't help, let us know and we can prescribe stronger Rx medication.

## 2020-06-20 NOTE — Telephone Encounter (Signed)
Spoke with pt asking for an update.  Says today he feels better.  Still has some mild nausea but improved with meds given by hospital.  Will try to eat today.  Taste is coming back but still can't smell.  Also says he can breathe better today but still has cough, gets worse at night.  Asking if Mucinex is something Dr. Darnell Level would suggest.  If not, then what?  C/o night sweats and extreme fatigue.  States sxs started about 06/10/20.

## 2020-06-20 NOTE — Telephone Encounter (Signed)
Spoke with pt relaying Dr. G's message. Pt verbalizes understanding.  

## 2020-06-25 NOTE — Telephone Encounter (Signed)
Lvm asking pt to call back.  Need an update on sxs.  

## 2020-06-26 NOTE — Telephone Encounter (Signed)
Lvm asking pt to call back.  Need an update on sxs.  

## 2020-06-27 NOTE — Telephone Encounter (Signed)
Spoke with pt asking for an update.  Says he is much better.  Cough and SOB has improved greatly.  Still dealing with mild fatigue, but improving.

## 2020-07-06 ENCOUNTER — Telehealth: Payer: Self-pay

## 2020-07-06 NOTE — Telephone Encounter (Signed)
I spoke with pt; pt said he is 85% better after + covid on 06/11/20. Pt said he is back to exercising and is not SOB. Pt said feels like someone is taking there hands and pressing at base of lower rib cages. Pt said he wants FU to make sure he is OK and pt cannot come in until next Tues. Pt scheduled appt on 07/10/20 at 10 AM with Dr Darnell Level. Pt has no covid symptoms, no travel and no known exposure to + covid. FYI to Dr Darnell Level.

## 2020-07-06 NOTE — Telephone Encounter (Signed)
Noted! Thank you

## 2020-07-06 NOTE — Telephone Encounter (Signed)
[place] [facility] - Client TELEPHONE ADVICE RECORD [facility] Patient Name: Eric Colon Gender: Male DOB: Jul 28, 1966 Age: 54 Y 9 M 13 D Return Phone Number: [phone] (Primary) Address: [place] City/State/Zip: [place] [address] Client [facility] - Client Client Site [place] - Day Physician Ria Bush - MD Contact Type Call Who Is Calling Patient / Member / Family / Caregiver Call Type Triage / Clinical Relationship To Patient Self Return Phone Number [phone] (Primary) Chief Complaint CHEST PAIN (>=21 years) - pain, pressure, heaviness or tightness Reason for Call Symptomatic / Request for [place] states, pt is post covid x2 weeks. Pt has pressure on chest and sob. Translation No Nurse Assessment Nurse: Joline Salt, RN, Malachy Mood Date/Time Eilene Ghazi Time): 07/05/2020 3:34:17 PM Confirm and document reason for call. If symptomatic, describe symptoms. ---Caller states is post Covid x2 weeks. He states it feels like someone is pushing on his chest when he is exercising. He is very fatigued. He also has some pain arm from a previous injury while working out. PO2 went from 86-87% when had Covid. Now he is 96-97%. Does the patient have any new or worsening symptoms? ---Yes Will a triage be completed? ---Yes Related visit to physician within the last 2 weeks? ---No Does the PT have any chronic conditions? (i.e. diabetes, asthma, this includes High risk factors for pregnancy, etc.) ---No Is this a behavioral health or substance abuse call? ---No Guidelines Guideline Title Affirmed Question Affirmed Notes Nurse Date/Time Eilene Ghazi Time) COVID-19 - Persisting Symptoms Follow-up Call [1] PERSISTING SYMPTOMS OF COVID-19 AND [2] symptoms SAME AND [3] medical visit for COVID-19 in past 2 weeks Catha Brow 07/05/2020 3:41:57 PM Disp. Time  Eilene Ghazi Time) Disposition Final User 07/05/2020 3:33:27 PM Send to Urgent Queue Lonia Farber 07/05/2020 3:49:04 PM See PCP within 2 Weeks Yes Joline Salt, RN, Malachy Mood PLEASE NOTE: All timestamps contained within this report are represented as [place] Standard Time. CONFIDENTIALTY NOTICE: This fax transmission is intended only for the addressee. It contains information that is legally privileged, confidential or otherwise protected from use or disclosure. If you are not the intended recipient, you are strictly prohibited from reviewing, disclosing, copying using or disseminating any of this information or taking any action in reliance on or regarding this information. If you have received this fax in error, please notify us immediately by telephone so that we can arrange for its return to [place]. Phone: [phone], Toll-Free: [phone], Fax: [phone] Page: 2 of 2 Call Id: [id number] [place] Disagree/Comply Comply Caller Understands Yes PreDisposition Did not know what to do Care Advice Given Per Guideline SEE PCP WITHIN 2 WEEKS: SYMPTOMS: REASSURANCE AND EDUCATION - PERSISTING COVID-19 SYMPTOMS: CARE ADVICE FOR FATIGUE: * Stay active: Recognize that you may not be able to accomplish as much as before. It is normal to feel tired after exercising. But, try to stay physically active. Walk or lightly exercise every day. * Stay hydrated: Drink plenty of liquids. Not drinking enough fluids and being a little dehydrated is a common cause of fatigue and weakness. If you think you are dehydrated, several glasses of fruit juice, other clear fluids, or water. This will improve hydration. CALL BACK IF: * You become worse * For many viral illnesses such as the common cold and the flu (influenza), most people are sick for 2 to 3 days and then get steadily better. Usually, within 2 to 3 weeks, people feel back to  their usual state of health. Referrals REFERRED TO PCP OFFICE

## 2020-07-10 ENCOUNTER — Ambulatory Visit (INDEPENDENT_AMBULATORY_CARE_PROVIDER_SITE_OTHER)
Admission: RE | Admit: 2020-07-10 | Discharge: 2020-07-10 | Disposition: A | Discharge status: Home / Self Care | Payer: BC Managed Care – PPO | Source: Ambulatory Visit | Attending: Family Medicine | Admitting: Family Medicine

## 2020-07-10 ENCOUNTER — Other Ambulatory Visit: Payer: Self-pay

## 2020-07-10 ENCOUNTER — Encounter: Payer: Self-pay | Admitting: Family Medicine

## 2020-07-10 ENCOUNTER — Ambulatory Visit: Payer: BC Managed Care – PPO | Admitting: Family Medicine

## 2020-07-10 VITALS — BP 118/84 | HR 80 | Temp 97.8°F | Ht 71.0 in | Wt 197.2 lb

## 2020-07-10 DIAGNOSIS — G8929 Other chronic pain: Secondary | ICD-10-CM | POA: Diagnosis not present

## 2020-07-10 DIAGNOSIS — U071 COVID-19: Secondary | ICD-10-CM

## 2020-07-10 DIAGNOSIS — M25512 Pain in left shoulder: Secondary | ICD-10-CM

## 2020-07-10 DIAGNOSIS — E663 Overweight: Secondary | ICD-10-CM | POA: Diagnosis not present

## 2020-07-10 HISTORY — DX: COVID-19: U07.1

## 2020-07-10 NOTE — Assessment & Plan Note (Addendum)
?  RTC injury vs labral tear + impingement based on exam today. Supportive care reviewed - short course of NSAIDs, rest, provided with exercises from [facility] pt advisor. Check xray today eval for signs of impingement. Discussed SM referral if not improving with above treatment.

## 2020-07-10 NOTE — Assessment & Plan Note (Addendum)
Congratulated on 50+ lb weight loss to date - he is motivated to continue healthy diet and lifestyle changes to date. Discussed ongoing activity levels post-COVID.

## 2020-07-10 NOTE — Patient Instructions (Addendum)
For left shoulder - xrays today. Start ibuprofen 400-600mg  twice daily with meals for 1 week, and do exercises provided today. If not better let me know for further evaluation.  For COVID - I think this is improving as expected.  EKG today.  Let me know how you're doing.

## 2020-07-10 NOTE — Progress Notes (Signed)
This visit was conducted in person.  BP 118/84 (BP Location: Right Arm, Patient Position: Sitting, Cuff Size: Normal)   Pulse 80   Temp 97.8 F (36.6 C) (Temporal)   Ht 5\' 11"  (1.803 m)   Wt 197 lb 3 oz (89.4 kg)   SpO2 96%   BMI 27.50 kg/m    CC: left sided rib cage pressure, shoulder pain Subjective:    Patient ID: Nolon Nations, male    DOB: 10-12-1966, 55 y.o.   MRN: [id number]  HPI: MARQUI FORMBY is a 54 y.o. male presenting on 07/10/2020 for Follow-up (Here for COVID f/u.  Noticed he gets winded quicker than ususal since working out at gym ) and Shoulder Pain (C/o left shoulder pain.  Started about 3 mos ago. )   COVID-19 illness 06/11/2020 dx at ER 06/18/2020. He was not vaccinated. He did receive mAb infusion treatment on 06/18/2020. CXR showed COVID pneumonia, CTA negative for PE.   Since COVID has felt pressure sensation to left ribcage as well as easy coughing with deep breathing. He returned to the gym yesterday underwent a hard workout (2 mile run on treadmill, lifting weights). After working out yesterday this pressure sensation has actually resolved. No persistent fevers.   Pre-covid baseline pulse 50-60, postcovid HR stays >80.   Also with 3 mo h/o L shoulder pain that started after shoulder blade massage at chiropractor or possibly while doing rows at gym. Most noticeable when reaching behind back or when lifting himself up out of bed. Some pain down lateral upper arm. Worse pain when laying on shoulder. No neck pain. Hasn't tried anything other than tylenol for this.   Progressive 50 lb weight loss, did keto diet for 3 months, continues intermittent fasting, started exercise program including regular gym use, uses APP on phone. Body fat went from 29% to 19%.      Relevant past medical, surgical, family and social history reviewed and updated as indicated. Interim medical history since our last visit reviewed. Allergies and medications reviewed and  updated. Outpatient Medications Prior to Visit  Medication Sig Dispense Refill  . pantoprazole (PROTONIX) 40 MG tablet Take 1 tablet (40 mg total) by mouth daily. As needed 30 tablet 6  . sildenafil (VIAGRA) 100 MG tablet Take 0.5-1 tablets (50-100 mg total) by mouth daily as needed for erectile dysfunction. 10 tablet 3  . terbinafine (LAMISIL) 250 MG tablet Take 1 tablet (250 mg total) by mouth daily. (Patient taking differently: Take 250 mg by mouth daily. As needed) 30 tablet 0  . sildenafil (REVATIO) 20 MG tablet Take 2-3 tablets (40-60 mg total) by mouth daily as needed (relations). (Patient not taking: Reported on 07/10/2020) 30 tablet 3  . benzonatate (TESSALON) 100 MG capsule Take 1 capsule (100 mg total) by mouth every 8 (eight) hours. 21 capsule 0  . ondansetron (ZOFRAN ODT) 4 MG disintegrating tablet Take 1 tablet (4 mg total) by mouth every 8 (eight) hours as needed for nausea or vomiting. 5 tablet 0  . phentermine 30 MG capsule Take 1 capsule (30 mg total) by mouth every morning. 30 capsule 0   No facility-administered medications prior to visit.     Per HPI unless specifically indicated in ROS section below Review of Systems Objective:  BP 118/84 (BP Location: Right Arm, Patient Position: Sitting, Cuff Size: Normal)   Pulse 80   Temp 97.8 F (36.6 C) (Temporal)   Ht 5\' 11"  (1.803 m)   Wt 197 lb 3  oz (89.4 kg)   SpO2 96%   BMI 27.50 kg/m   Wt Readings from Last 3 Encounters:  07/10/20 197 lb 3 oz (89.4 kg)  06/19/20 246 lb 14.6 oz (112 kg)  05/10/19 249 lb (112.9 kg)      Physical Exam Vitals and nursing note reviewed.  Constitutional:      Appearance: Normal appearance. He is not ill-appearing.  Cardiovascular:     Rate and Rhythm: Normal rate and regular rhythm.     Pulses: Normal pulses.     Heart sounds: Normal heart sounds. No murmur heard.   Pulmonary:     Effort: Pulmonary effort is normal. No respiratory distress.     Breath sounds: Normal breath sounds.  No wheezing, rhonchi or rales.  Musculoskeletal:        General: Tenderness present. No swelling. Normal range of motion.     Right lower leg: No edema.     Left lower leg: No edema.     Comments:  R shoulder WNL L shoulder exam: No deformity of shoulders on inspection. No pain with palpation of shoulder landmarks. FROM in abduction and forward flexion. Discomfort with testing SITS in int rotation. Discomfort with empty can sign. Neg Speed test. Painful impingement test. No pain with crossover test. Crepitus and pain with rotation of humeral head in [place] joint.   Skin:    General: Skin is warm and dry.     Findings: No rash.  Neurological:     Mental Status: He is alert.  Psychiatric:        Mood and Affect: Mood normal.        Behavior: Behavior normal.       Results for orders placed or performed during the hospital encounter of 66/29/47  Basic metabolic panel  Result Value Ref Range   Sodium 139 135 - 145 mmol/L   Potassium 3.8 3.5 - 5.1 mmol/L   Chloride 103 98 - 111 mmol/L   CO2 26 22 - 32 mmol/L   Glucose, Bld 114 (H) 70 - 99 mg/dL   BUN 19 6 - 20 mg/dL   Creatinine, Ser 1.33 (H) 0.61 - 1.24 mg/dL   Calcium 8.5 (L) 8.9 - 10.3 mg/dL   GFR calc non Af Amer >60 >60 mL/min   GFR calc Af Amer >60 >60 mL/min   Anion gap 10 5 - 15  CBC  Result Value Ref Range   WBC 3.1 (L) 4.0 - 10.5 K/uL   RBC 4.95 4.22 - 5.81 MIL/uL   Hemoglobin 14.1 13.0 - 17.0 g/dL   HCT 43.3 39 - 52 %   MCV 87.5 80.0 - 100.0 fL   MCH 28.5 26.0 - 34.0 pg   MCHC 32.6 30.0 - 36.0 g/dL   RDW 12.5 11.5 - 15.5 %   Platelets 135 (L) 150 - 400 K/uL   nRBC 0.0 0.0 - 0.2 %  Hepatic function panel  Result Value Ref Range   Total Protein 5.6 (L) 6.5 - 8.1 g/dL   Albumin 2.9 (L) 3.5 - 5.0 g/dL   AST 48 (H) 15 - 41 U/L   ALT 34 0 - 44 U/L   Alkaline Phosphatase 44 38 - 126 U/L   Total Bilirubin 1.6 (H) 0.3 - 1.2 mg/dL   Bilirubin, Direct 0.4 (H) 0.0 - 0.2 mg/dL   Indirect Bilirubin 1.2 (H) 0.3 - 0.9  mg/dL  Troponin I (High Sensitivity)  Result Value Ref Range   Troponin I (High Sensitivity) 5 <  18 ng/L  Troponin I (High Sensitivity)  Result Value Ref Range   Troponin I (High Sensitivity) 4 <18 ng/L   EKG - sinus bradycardia 50s, normal LAD, normal intervals, no acute ST/T changes.  Assessment & Plan:  This visit occurred during the SARS-CoV-2 public health emergency.  Safety protocols were in place, including screening questions prior to the visit, additional usage of staff PPE, and extensive cleaning of exam room while observing appropriate contact time as indicated for disinfecting solutions.   Problem List Items Addressed This Visit    Overweight with body mass index (BMI) 25.0-29.9    Congratulated on 50+ lb weight loss to date - he is motivated to continue healthy diet and lifestyle changes to date. Discussed ongoing activity levels post-COVID.       COVID-19 virus infection - Primary    Overall seems to be recovering very well. Initial residual post-covid symptoms of dyspnea, faint chest pressure sensation, easy coughing - but actually after yesterday's workout feeling markedly better.  Portion of visit spent discussing COVID related issues including vaccination. I did encourage he get vaccinated.       Relevant Orders   EKG 12-Lead (Completed)   Chronic pain in left shoulder    ?RTC injury vs labral tear + impingement based on exam today. Supportive care reviewed - short course of NSAIDs, rest, provided with exercises from [facility] pt advisor. Check xray today eval for signs of impingement. Discussed SM referral if not improving with above treatment.       Relevant Orders   DG Shoulder Left       No orders of the defined types were placed in this encounter.  Orders Placed This Encounter  Procedures  . DG Shoulder Left    Standing Status:   Future    Number of Occurrences:   1    Standing Expiration Date:   07/10/2021    Order Specific Question:   Reason for Exam (SYMPTOM   OR DIAGNOSIS REQUIRED)    Answer:   L shoulder pain    Order Specific Question:   Preferred imaging location?    Answer:   [facility]    Order Specific Question:   Radiology Contrast Protocol - do NOT remove file path    Answer:   [email or web address]  . EKG 12-Lead    Patient Instructions  For left shoulder - xrays today. Start ibuprofen 400-600mg  twice daily with meals for 1 week, and do exercises provided today. If not better let me know for further evaluation.  For COVID - I think this is improving as expected.  EKG today.  Let me know how you're doing.    Follow up plan: Return if symptoms worsen or fail to improve.  Ria Bush, MD

## 2020-07-10 NOTE — Assessment & Plan Note (Signed)
Overall seems to be recovering very well. Initial residual post-covid symptoms of dyspnea, faint chest pressure sensation, easy coughing - but actually after yesterday's workout feeling markedly better.  Portion of visit spent discussing COVID related issues including vaccination. I did encourage he get vaccinated.

## 2020-07-23 ENCOUNTER — Other Ambulatory Visit: Payer: Self-pay

## 2020-07-23 NOTE — Telephone Encounter (Signed)
Pt states he meant to ask for refill at last OV but forgot.  He is aware Dr. Darnell Level is out of the office.  Lamisil Last rx:  05/10/19, #30 Last OV: 07/10/20,  COVID virus inf Next OV:  none

## 2020-07-24 MED ORDER — TERBINAFINE HCL 250 MG PO TABS
250.0000 mg | ORAL_TABLET | Freq: Every day | ORAL | 0 refills | Status: DC
Start: 2020-07-24 — End: 2021-03-20

## 2020-07-24 NOTE — Telephone Encounter (Signed)
ERx 

## 2021-01-29 ENCOUNTER — Other Ambulatory Visit: Payer: Self-pay | Admitting: Family Medicine

## 2021-01-29 NOTE — Telephone Encounter (Signed)
Refill request Lamisil Last refill 07/24/20 #30 Last office visit 07/10/20

## 2021-01-29 NOTE — Telephone Encounter (Signed)
Refill request Sildenafil Last refill 11/09/18 #30/3 Last office visit 07/10/20

## 2021-03-08 ENCOUNTER — Other Ambulatory Visit: Payer: Self-pay

## 2021-03-08 ENCOUNTER — Ambulatory Visit: Payer: BC Managed Care – PPO | Admitting: Family Medicine

## 2021-03-08 ENCOUNTER — Encounter: Payer: Self-pay | Admitting: Family Medicine

## 2021-03-08 VITALS — BP 120/80 | HR 78 | Temp 97.6°F | Ht 71.0 in | Wt 232.2 lb

## 2021-03-08 DIAGNOSIS — R21 Rash and other nonspecific skin eruption: Secondary | ICD-10-CM | POA: Diagnosis not present

## 2021-03-08 MED ORDER — FLUCONAZOLE 150 MG PO TABS: 150.0000 mg | ORAL_TABLET | ORAL | 0 refills | Status: DC

## 2021-03-08 MED ORDER — PANTOPRAZOLE SODIUM 40 MG PO TBEC
40.0000 mg | DELAYED_RELEASE_TABLET | Freq: Every day | ORAL | 6 refills | Status: DC | PRN
Start: 2021-03-08 — End: 2021-03-20

## 2021-03-08 MED ORDER — CLARITHROMYCIN 500 MG PO TABS: 1000.0000 mg | ORAL_TABLET | Freq: Once | ORAL | 0 refills | Status: AC

## 2021-03-08 NOTE — Patient Instructions (Addendum)
Likely recurrent fungal infection, possible bacterial component. Treat with diflucan 150mg  weekly for 4 weeks, but first take antibiotic clarithromycin 2 pills once.  Let us know if not improved with this, we may discuss return to dermatology.

## 2021-03-08 NOTE — Progress Notes (Addendum)
Patient ID: Eric Colon, male    DOB: May 29, 1966, 55 y.o.   MRN: [id number]  This visit was conducted in person.  BP 120/80   Pulse 78   Temp 97.6 F (36.4 C) (Temporal)   Ht 5\' 11"  (1.803 m)   Wt 232 lb 4 oz (105.3 kg)   SpO2 99%   BMI 32.39 kg/m    CC: rash  Subjective:   HPI: Eric Colon is a 55 y.o. male presenting on 03/08/2021 for Rash (C/o recurrent across lower abd and now on anterior neck.  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   Recurrent rash for years that comes and goes, normally at anterior belt line into buttocks, now spreading into neck. Very itchy dry rash. Has tried home remedies including tea tree oil without benefit.  Has previously used butenafine antifungal cream with benefit.  Oral lamisil also resolves rash while on it.  Has seen dermatology for this in the past, treated for fungal colonization with 3 months of lamisil but rash quickly returned.   No new lotions ,detergents, soaps or shampoos.  No new foods or medicines or supplements.  No oral lesions, no fevers/chills, nausea, joint pains.  No lesions on arms or legs or back.     Relevant past medical, surgical, family and social history reviewed and updated as indicated. Interim medical history since, our last visit reviewed. Allergies and medications reviewed and updated. Outpatient Medications Prior to Visit  Medication Sig Dispense Refill  . sildenafil (REVATIO) 20 MG tablet TAKE 2-3 TABLETS BY MOUTH ONCE DAILY AS NEEDED FOR RELATIONS 30 tablet 3  . terbinafine (LAMISIL) 250 MG tablet Take 1 tablet (250 mg total) by mouth daily. (Patient taking differently: Take 250 mg by mouth daily. As needed) 30 tablet 0  . pantoprazole (PROTONIX) 40 MG tablet Take 1 tablet (40 mg total) by mouth daily. As needed 30 tablet 6  . sildenafil (VIAGRA) 100 MG tablet Take 0.5-1 tablets (50-100 mg total) by mouth daily as needed for erectile dysfunction. 10 tablet 3   No facility-administered  medications prior to visit.     Per HPI unless specifically indicated in ROS section below Review of Systems Objective:  BP 120/80   Pulse 78   Temp 97.6 F (36.4 C) (Temporal)   Ht 5\' 11"  (1.803 m)   Wt 232 lb 4 oz (105.3 kg)   SpO2 99%   BMI 32.39 kg/m   Wt Readings from Last 3 Encounters:  03/08/21 232 lb 4 oz (105.3 kg)  07/10/20 197 lb 3 oz (89.4 kg)  06/19/20 246 lb 14.6 oz (112 kg)      Physical Exam Vitals and nursing note reviewed.  Constitutional:      Appearance: Normal appearance. He is not ill-appearing.  Skin:    General: Skin is warm and dry.     Findings: Erythema and rash present.          Comments:  Pruritic erythematous macular mildly scaly rash throughout lower abdomen into bilateral buttocks with predominant sparing of extremities  Upper back with diffuse peeling skin as seen after a sunburn  Woods lamp illumination shows well demarcated erythematous rash however without fluorescence  Neurological:     Mental Status: He is alert.       Results for orders placed or performed in visit on 03/08/21  POCT Skin KOH  Result Value Ref Range   Skin KOH, POC Positive (A) Negative  Lab Results  Component Value Date   ALT 34 06/19/2020   AST 48 (H) 06/19/2020   ALKPHOS 44 06/19/2020   BILITOT 1.6 (H) 06/19/2020    Assessment & Plan:  This visit occurred during the SARS-CoV-2 public health emergency.  Safety protocols were in place, including screening questions prior to the visit, additional usage of staff PPE, and extensive cleaning of exam room while observing appropriate contact time as indicated for disinfecting solutions.   Encouraged he return for CPE as overdue.  Per patient request, MyChart deactivated.  Problem List Items Addressed This Visit    Skin rash - Primary    Longstanding issue. Previously treated for tinea cruris, tinea corporis, responds to lamisil but then rash largely recurs.  Exam today shows diffuse itchy red slightly  raised and scaly rash throughout lower trunk.  Woods lamp shows erythema but no true coral red fluorescence.  KOH positive for hyphae.  Will send gram stain of skin scrapings looking for Erythrasma (Corynebacterium). Will treat empirically with clarithromycin 1gm then start diflucan 150mg  weekly x 1 month. Consider return to derm if persistent despite this.       Relevant Orders   Gram stain   POCT Skin KOH (Completed)       Meds ordered this encounter  Medications  . pantoprazole (PROTONIX) 40 MG tablet    Sig: Take 1 tablet (40 mg total) by mouth daily as needed (GERD).    Dispense:  30 tablet    Refill:  6  . clarithromycin (BIAXIN) 500 MG tablet    Sig: Take 2 tablets (1,000 mg total) by mouth once for 1 dose.    Dispense:  2 tablet    Refill:  0  . fluconazole (DIFLUCAN) 150 MG tablet    Sig: Take 1 tablet (150 mg total) by mouth once a week.    Dispense:  4 tablet    Refill:  0   Orders Placed This Encounter  Procedures  . Gram stain    Skin scrapings to rash on bilat buttocks  . POCT Skin KOH    Patient Instructions  Likely recurrent fungal infection, possible bacterial component. Treat with diflucan 150mg  weekly for 4 weeks, but first take antibiotic clarithromycin 2 pills once.  Let us know if not improved with this, we may discuss return to dermatology.    Follow up plan: Return if symptoms worsen or fail to improve.  Ria Bush, MD

## 2021-03-09 DIAGNOSIS — B354 Tinea corporis: Secondary | ICD-10-CM | POA: Insufficient documentation

## 2021-03-09 DIAGNOSIS — R21 Rash and other nonspecific skin eruption: Secondary | ICD-10-CM | POA: Insufficient documentation

## 2021-03-09 LAB — POCT SKIN KOH: Skin KOH, POC: POSITIVE — AB

## 2021-03-09 LAB — GRAM STAIN
MICRO NUMBER:: 11943783
SPECIMEN QUALITY:: ADEQUATE

## 2021-03-09 NOTE — Assessment & Plan Note (Signed)
Longstanding issue. Previously treated for tinea cruris, tinea corporis, responds to lamisil but then rash largely recurs.  Exam today shows diffuse itchy red slightly raised and scaly rash throughout lower trunk.  Woods lamp shows erythema but no true coral red fluorescence.  KOH positive for hyphae.  Will send gram stain of skin scrapings looking for Erythrasma (Corynebacterium). Will treat empirically with clarithromycin 1gm then start diflucan 150mg  weekly x 1 month. Consider return to derm if persistent despite this.

## 2021-03-12 ENCOUNTER — Other Ambulatory Visit: Payer: Self-pay | Admitting: Family Medicine

## 2021-03-12 DIAGNOSIS — N289 Disorder of kidney and ureter, unspecified: Secondary | ICD-10-CM

## 2021-03-12 DIAGNOSIS — E039 Hypothyroidism, unspecified: Secondary | ICD-10-CM

## 2021-03-12 DIAGNOSIS — Z125 Encounter for screening for malignant neoplasm of prostate: Secondary | ICD-10-CM

## 2021-03-12 DIAGNOSIS — Z1159 Encounter for screening for other viral diseases: Secondary | ICD-10-CM

## 2021-03-13 ENCOUNTER — Other Ambulatory Visit: Payer: Self-pay

## 2021-03-13 ENCOUNTER — Other Ambulatory Visit (INDEPENDENT_AMBULATORY_CARE_PROVIDER_SITE_OTHER): Payer: BC Managed Care – PPO

## 2021-03-13 DIAGNOSIS — Z125 Encounter for screening for malignant neoplasm of prostate: Secondary | ICD-10-CM | POA: Diagnosis not present

## 2021-03-13 DIAGNOSIS — N289 Disorder of kidney and ureter, unspecified: Secondary | ICD-10-CM | POA: Diagnosis not present

## 2021-03-13 DIAGNOSIS — E039 Hypothyroidism, unspecified: Secondary | ICD-10-CM

## 2021-03-13 DIAGNOSIS — Z1159 Encounter for screening for other viral diseases: Secondary | ICD-10-CM

## 2021-03-13 LAB — COMPREHENSIVE METABOLIC PANEL
ALT: 20 U/L (ref 0–53)
AST: 13 U/L (ref 0–37)
Albumin: 4.7 g/dL (ref 3.5–5.2)
Alkaline Phosphatase: 57 U/L (ref 39–117)
BUN: 23 mg/dL (ref 6–23)
CO2: 28 mEq/L (ref 19–32)
Calcium: 9.9 mg/dL (ref 8.4–10.5)
Chloride: 102 mEq/L (ref 96–112)
Creatinine, Ser: 1.42 mg/dL (ref 0.40–1.50)
GFR: 56.43 mL/min — ABNORMAL LOW (ref >60.00)
Glucose, Bld: 98 mg/dL (ref 70–99)
Potassium: 4.4 mEq/L (ref 3.5–5.1)
Sodium: 138 mEq/L (ref 135–145)
Total Bilirubin: 0.7 mg/dL (ref 0.2–1.2)
Total Protein: 7.2 g/dL (ref 6.0–8.3)

## 2021-03-13 LAB — LIPID PANEL
Cholesterol: 236 mg/dL — ABNORMAL HIGH (ref 0–200)
HDL: 62.4 mg/dL
LDL Cholesterol: 155 mg/dL — ABNORMAL HIGH (ref 0–99)
NonHDL: 173.59
Total CHOL/HDL Ratio: 4
Triglycerides: 93 mg/dL (ref 0.0–149.0)
VLDL: 18.6 mg/dL (ref 0.0–40.0)

## 2021-03-13 LAB — CBC WITH DIFFERENTIAL/PLATELET
Basophils Absolute: 0.1 10*3/uL (ref 0.0–0.1)
Basophils Relative: 1 % (ref 0.0–3.0)
Eosinophils Absolute: 0.2 10*3/uL (ref 0.0–0.7)
Eosinophils Relative: 2.9 % (ref 0.0–5.0)
HCT: 45.5 % (ref 39.0–52.0)
Hemoglobin: 15.5 g/dL (ref 13.0–17.0)
Lymphocytes Relative: 27.7 % (ref 12.0–46.0)
Lymphs Abs: 1.6 10*3/uL (ref 0.7–4.0)
MCHC: 34 g/dL (ref 30.0–36.0)
MCV: 86.8 fl (ref 78.0–100.0)
Monocytes Absolute: 0.6 10*3/uL (ref 0.1–1.0)
Monocytes Relative: 9.8 % (ref 3.0–12.0)
Neutro Abs: 3.4 10*3/uL (ref 1.4–7.7)
Neutrophils Relative %: 58.6 % (ref 43.0–77.0)
Platelets: 207 10*3/uL (ref 150.0–400.0)
RBC: 5.24 Mil/uL (ref 4.22–5.81)
RDW: 13.2 % (ref 11.5–15.5)
WBC: 5.8 10*3/uL (ref 4.0–10.5)

## 2021-03-13 LAB — T4, FREE: Free T4: 0.89 ng/dL (ref 0.60–1.60)

## 2021-03-13 LAB — PSA: PSA: 3.04 ng/mL (ref 0.10–4.00)

## 2021-03-13 LAB — TSH: TSH: 6.56 u[IU]/mL — ABNORMAL HIGH (ref 0.35–4.50)

## 2021-03-14 LAB — HEPATITIS C ANTIBODY
Hepatitis C Ab: NONREACTIVE
SIGNAL TO CUT-OFF: 0.01 (ref <1.00)

## 2021-03-20 ENCOUNTER — Encounter: Payer: Self-pay | Admitting: Family Medicine

## 2021-03-20 ENCOUNTER — Ambulatory Visit (INDEPENDENT_AMBULATORY_CARE_PROVIDER_SITE_OTHER): Payer: BC Managed Care – PPO | Admitting: Family Medicine

## 2021-03-20 ENCOUNTER — Other Ambulatory Visit: Payer: Self-pay

## 2021-03-20 VITALS — BP 120/80 | HR 72 | Temp 97.8°F | Ht 71.0 in | Wt 234.1 lb

## 2021-03-20 DIAGNOSIS — E669 Obesity, unspecified: Secondary | ICD-10-CM

## 2021-03-20 DIAGNOSIS — K219 Gastro-esophageal reflux disease without esophagitis: Secondary | ICD-10-CM | POA: Diagnosis not present

## 2021-03-20 DIAGNOSIS — Z0000 Encounter for general adult medical examination without abnormal findings: Secondary | ICD-10-CM | POA: Diagnosis not present

## 2021-03-20 DIAGNOSIS — Z1211 Encounter for screening for malignant neoplasm of colon: Secondary | ICD-10-CM | POA: Diagnosis not present

## 2021-03-20 DIAGNOSIS — N289 Disorder of kidney and ureter, unspecified: Secondary | ICD-10-CM

## 2021-03-20 DIAGNOSIS — E039 Hypothyroidism, unspecified: Secondary | ICD-10-CM

## 2021-03-20 DIAGNOSIS — B354 Tinea corporis: Secondary | ICD-10-CM

## 2021-03-20 DIAGNOSIS — R5383 Other fatigue: Secondary | ICD-10-CM

## 2021-03-20 MED ORDER — LEVOTHYROXINE SODIUM 50 MCG PO TABS: 50.0000 ug | ORAL_TABLET | Freq: Every day | ORAL | 6 refills | Status: DC

## 2021-03-20 MED ORDER — PANTOPRAZOLE SODIUM 40 MG PO TBEC
40.0000 mg | DELAYED_RELEASE_TABLET | Freq: Every day | ORAL | 6 refills | Status: DC | PRN
Start: 2021-03-20 — End: 2023-07-01

## 2021-03-20 NOTE — Assessment & Plan Note (Signed)
Reviewed healthy diet and lifestyle changes to affect sustainable weight loss.  

## 2021-03-20 NOTE — Patient Instructions (Addendum)
Consider shingrix vaccine series and covid shots.  Good to see you today Return as needed or in 1 year for next physical. Thyroid levels remaining borderline low. Trial thyroid replacement levothyroxine 12mcg daily - take first thing in the morning on an empty stomach and separate from other medicines by a few hours if able, separate from food by 30-60 minutes.  Return in 4-6 weeks for lab visit to recheck thyroid levels.   Health Maintenance, Male Adopting a healthy lifestyle and getting preventive care are important in promoting health and wellness. Ask your health care provider about:  The right schedule for you to have regular tests and exams.  Things you can do on your own to prevent diseases and keep yourself healthy. What should I know about diet, weight, and exercise? Eat a healthy diet  Eat a diet that includes plenty of vegetables, fruits, low-fat dairy products, and lean protein.  Do not eat a lot of foods that are high in solid fats, added sugars, or sodium.   Maintain a healthy weight Body mass index (BMI) is a measurement that can be used to identify possible weight problems. It estimates body fat based on height and weight. Your health care provider can help determine your BMI and help you achieve or maintain a healthy weight. Get regular exercise Get regular exercise. This is one of the most important things you can do for your health. Most adults should:  Exercise for at least 150 minutes each week. The exercise should increase your heart rate and make you sweat (moderate-intensity exercise).  Do strengthening exercises at least twice a week. This is in addition to the moderate-intensity exercise.  Spend less time sitting. Even light physical activity can be beneficial. Watch cholesterol and blood lipids Have your blood tested for lipids and cholesterol at 54 years of age, then have this test every 5 years. You may need to have your cholesterol levels checked more often  if:  Your lipid or cholesterol levels are high.  You are older than 54 years of age.  You are at high risk for heart disease. What should I know about cancer screening? Many types of cancers can be detected early and may often be prevented. Depending on your health history and family history, you may need to have cancer screening at various ages. This may include screening for:  Colorectal cancer.  Prostate cancer.  Skin cancer.  Lung cancer. What should I know about heart disease, diabetes, and high blood pressure? Blood pressure and heart disease  High blood pressure causes heart disease and increases the risk of stroke. This is more likely to develop in people who have high blood pressure readings, are of African descent, or are overweight.  Talk with your health care provider about your target blood pressure readings.  Have your blood pressure checked: ? Every 3-5 years if you are 44-12 years of age. ? Every year if you are 64 years old or older.  If you are between the ages of 39 and 43 and are a current or former smoker, ask your health care provider if you should have a one-time screening for abdominal aortic aneurysm (AAA). Diabetes Have regular diabetes screenings. This checks your fasting blood sugar level. Have the screening done:  Once every three years after age 39 if you are at a normal weight and have a low risk for diabetes.  More often and at a younger age if you are overweight or have a high risk for diabetes.  What should I know about preventing infection? Hepatitis B If you have a higher risk for hepatitis B, you should be screened for this virus. Talk with your health care provider to find out if you are at risk for hepatitis B infection. Hepatitis C Blood testing is recommended for:  Everyone born from [birth year] through 1964.  Anyone with known risk factors for hepatitis C. Sexually transmitted infections (STIs)  You should be screened each year for STIs,  including gonorrhea and chlamydia, if: ? You are sexually active and are younger than 54 years of age. ? You are older than 54 years of age and your health care provider tells you that you are at risk for this type of infection. ? Your sexual activity has changed since you were last screened, and you are at increased risk for chlamydia or gonorrhea. Ask your health care provider if you are at risk.  Ask your health care provider about whether you are at high risk for HIV. Your health care provider may recommend a prescription medicine to help prevent HIV infection. If you choose to take medicine to prevent HIV, you should first get tested for HIV. You should then be tested every 3 months for as long as you are taking the medicine. Follow these instructions at home: Lifestyle  Do not use any products that contain nicotine or tobacco, such as cigarettes, e-cigarettes, and chewing tobacco. If you need help quitting, ask your health care provider.  Do not use street drugs.  Do not share needles.  Ask your health care provider for help if you need support or information about quitting drugs. Alcohol use  Do not drink alcohol if your health care provider tells you not to drink.  If you drink alcohol: ? Limit how much you have to 0-2 drinks a day. ? Be aware of how much alcohol is in your drink. In the [place] one drink equals one 12 oz bottle of beer (355 mL), one 5 oz glass of wine (148 mL), or one 1 oz glass of hard liquor (44 mL). General instructions  Schedule regular health, dental, and eye exams.  Stay current with your vaccines.  Tell your health care provider if: ? You often feel depressed. ? You have ever been abused or do not feel safe at home. Summary  Adopting a healthy lifestyle and getting preventive care are important in promoting health and wellness.  Follow your health care provider's instructions about healthy diet, exercising, and getting tested or screened for  diseases.  Follow your health care provider's instructions on monitoring your cholesterol and blood pressure. This information is not intended to replace advice given to you by your health care provider. Make sure you discuss any questions you have with your health care provider. Document Revised: 09/22/2018 Document Reviewed: 09/22/2018 Elsevier Patient Education  2021 [employer].

## 2021-03-20 NOTE — Assessment & Plan Note (Signed)
Preventative protocols reviewed and updated unless pt declined. Discussed healthy diet and lifestyle.  

## 2021-03-20 NOTE — Assessment & Plan Note (Signed)
Mild decrease in GFR - encouraged increased water intake.

## 2021-03-20 NOTE — Progress Notes (Signed)
Patient ID: Eric Colon, male    DOB: 16-Sep-1966, 55 y.o.   MRN: [id number]  This visit was conducted in person.  BP 120/80   Pulse 72   Temp 97.8 F (36.6 C) (Temporal)   Ht 5\' 11"  (1.803 m)   Wt 234 lb 2 oz (106.2 kg)   SpO2 97%   BMI 32.65 kg/m    CC: CPE  Subjective:   HPI: Eric Colon is a 55 y.o. male presenting on 03/20/2021 for Annual Exam   Recently seen for skin rash - KOH positive, gram stain negative for Corynebacterium. Treated with diflucan 150mg  x 4 wks and clarithromycin 1gm. Significant improvement - clearest skin has been in a while.   Elevated TSH with normal fT4 - notes fatigue, some increased cold intolerance, dry skin.  No constipation, palpitations. No fmhx thyroid disease.  No depressed mood. Notes somewhat decreased libido.   Preventative: Colon cancer screening - discussed - would like colonoscopy  Prostate cancer screening - no prostate symptoms or fmhx - agrees to yearly PSA  Lung cancer screen - not eligible  Flu shot declines  COVID vaccine - discussed, declined  Tdap 10/2016  Shingrix - discussed  Seat belt use discussed Sunscreen use discussed. No changing moles on skin.  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 Interim medical history since our last visit reviewed. Allergies and medications reviewed and updated. Outpatient Medications Prior to Visit  Medication Sig Dispense Refill  . fluconazole (DIFLUCAN) 150 MG tablet Take 1 tablet (150 mg total) by mouth once a week. 4 tablet 0  . sildenafil (REVATIO) 20 MG tablet TAKE 2-3 TABLETS BY  MOUTH ONCE DAILY AS NEEDED FOR RELATIONS 30 tablet 3  . pantoprazole (PROTONIX) 40 MG tablet Take 1 tablet (40 mg total) by mouth daily as needed (GERD). 30 tablet 6  . terbinafine (LAMISIL) 250 MG tablet Take 1 tablet (250 mg total) by mouth daily. (Patient taking differently: Take 250 mg by mouth daily. As needed) 30 tablet 0   No facility-administered medications prior to visit.     Per HPI unless specifically indicated in ROS section below Review of Systems  Constitutional: Negative for activity change, appetite change, chills, fatigue, fever and unexpected weight change.  HENT: Negative for hearing loss.   Eyes: Negative for visual disturbance.  Respiratory: Positive for cough. Negative for chest tightness, shortness of breath and wheezing.   Cardiovascular: Negative for chest pain, palpitations and leg swelling.  Gastrointestinal: Negative for abdominal distention, abdominal pain, blood in stool, constipation, diarrhea, nausea and vomiting.       Heartburn  Endocrine: Positive for cold intolerance.  Genitourinary: Negative for difficulty urinating and hematuria.  Musculoskeletal: Negative for arthralgias, myalgias and neck pain.  Skin: Negative for rash.  Neurological: Negative for dizziness, seizures, syncope and headaches.  Hematological: Negative for adenopathy. Does not bruise/bleed easily.  Psychiatric/Behavioral: Positive for dysphoric mood (mild). The patient is not nervous/anxious.    Objective:  BP 120/80   Pulse 72   Temp 97.8 F (36.6 C) (Temporal)  Ht 5\' 11"  (1.803 m)   Wt 234 lb 2 oz (106.2 kg)   SpO2 97%   BMI 32.65 kg/m   Wt Readings from Last 3 Encounters:  03/20/21 234 lb 2 oz (106.2 kg)  03/08/21 232 lb 4 oz (105.3 kg)  07/10/20 197 lb 3 oz (89.4 kg)      Physical Exam Vitals and nursing note reviewed.  Constitutional:      General: He is not in acute distress.    Appearance: Normal appearance. He is well-developed. He is not ill-appearing.  HENT:      Head: Normocephalic and atraumatic.     Right Ear: Hearing, tympanic membrane, ear canal and external ear normal.     Left Ear: Hearing, tympanic membrane, ear canal and external ear normal.  Eyes:     General: No scleral icterus.    Extraocular Movements: Extraocular movements intact.     Conjunctiva/sclera: Conjunctivae normal.     Pupils: Pupils are equal, round, and reactive to light.  Neck:     Thyroid: No thyroid mass or thyromegaly.  Cardiovascular:     Rate and Rhythm: Normal rate and regular rhythm.     Pulses: Normal pulses.          Radial pulses are 2+ on the right side and 2+ on the left side.     Heart sounds: Normal heart sounds. No murmur heard.   Pulmonary:     Effort: Pulmonary effort is normal. No respiratory distress.     Breath sounds: Normal breath sounds. No wheezing, rhonchi or rales.  Abdominal:     General: Bowel sounds are normal. There is no distension.     Palpations: Abdomen is soft. There is no mass.     Tenderness: There is no abdominal tenderness. There is no guarding or rebound.     Hernia: No hernia is present.  Musculoskeletal:        General: Normal range of motion.     Cervical back: Normal range of motion and neck supple.     Right lower leg: No edema.     Left lower leg: No edema.  Lymphadenopathy:     Cervical: No cervical adenopathy.  Skin:    General: Skin is warm and dry.     Findings: No rash.  Neurological:     General: No focal deficit present.     Mental Status: He is alert and oriented to person, place, and time.     Comments: CN grossly intact, station and gait intact  Psychiatric:        Mood and Affect: Mood normal.        Behavior: Behavior normal.        Thought Content: Thought content normal.        Judgment: Judgment normal.       Results for orders placed or performed in visit on 03/13/21  Hepatitis C antibody  Result Value Ref Range   Hepatitis C Ab NON-REACTIVE NON-REACTIVE   SIGNAL TO CUT-OFF 0.01  <1.00  PSA  Result Value Ref Range   PSA 3.04 0.10 - 4.00 ng/mL  CBC with Differential/Platelet  Result Value Ref Range   WBC 5.8 4.0 - 10.5 K/uL   RBC 5.24 4.22 - 5.81 Mil/uL   Hemoglobin 15.5 13.0 - 17.0 g/dL   HCT 45.5 39.0 - 52.0 %   MCV 86.8 78.0 - 100.0 fl   MCHC 34.0 30.0 - 36.0 g/dL   RDW 13.2 11.5 - 15.5 %  Platelets 207.0 150.0 - 400.0 K/uL   Neutrophils Relative % 58.6 43.0 - 77.0 %   Lymphocytes Relative 27.7 12.0 - 46.0 %   Monocytes Relative 9.8 3.0 - 12.0 %   Eosinophils Relative 2.9 0.0 - 5.0 %   Basophils Relative 1.0 0.0 - 3.0 %   Neutro Abs 3.4 1.4 - 7.7 K/uL   Lymphs Abs 1.6 0.7 - 4.0 K/uL   Monocytes Absolute 0.6 0.1 - 1.0 K/uL   Eosinophils Absolute 0.2 0.0 - 0.7 K/uL   Basophils Absolute 0.1 0.0 - 0.1 K/uL  T4, free  Result Value Ref Range   Free T4 0.89 0.60 - 1.60 ng/dL  TSH  Result Value Ref Range   TSH 6.56 (H) 0.35 - 4.50 uIU/mL  Comprehensive metabolic panel  Result Value Ref Range   Sodium 138 135 - 145 mEq/L   Potassium 4.4 3.5 - 5.1 mEq/L   Chloride 102 96 - 112 mEq/L   CO2 28 19 - 32 mEq/L   Glucose, Bld 98 70 - 99 mg/dL   BUN 23 6 - 23 mg/dL   Creatinine, Ser 1.42 0.40 - 1.50 mg/dL   Total Bilirubin 0.7 0.2 - 1.2 mg/dL   Alkaline Phosphatase 57 39 - 117 U/L   AST 13 0 - 37 U/L   ALT 20 0 - 53 U/L   Total Protein 7.2 6.0 - 8.3 g/dL   Albumin 4.7 3.5 - 5.2 g/dL   GFR 56.43 (L) >60.00 mL/min   Calcium 9.9 8.4 - 10.5 mg/dL  Lipid panel  Result Value Ref Range   Cholesterol 236 (H) 0 - 200 mg/dL   Triglycerides 93.0 0.0 - 149.0 mg/dL   HDL 62.40 >39.00 mg/dL   VLDL 18.6 0.0 - 40.0 mg/dL   LDL Cholesterol 155 (H) 0 - 99 mg/dL   Total CHOL/HDL Ratio 4    NonHDL 173.59    Lab Results  Component Value Date   VITAMINB12 529 01/06/2014    Depression screen PHQ 2/9 03/20/2021  Decreased Interest 1  Down, Depressed, Hopeless 1  PHQ - 2 Score 2  Altered sleeping 1  Tired, decreased energy 2  Change in appetite 1  Feeling bad or  failure about yourself  1  Trouble concentrating 2  Moving slowly or fidgety/restless 0  Suicidal thoughts 1  PHQ-9 Score 10    GAD 7 : Generalized Anxiety Score 03/20/2021  Nervous, Anxious, on Edge 0  Control/stop worrying 1  Worry too much - different things 1  Trouble relaxing 0  Restless 0  Easily annoyed or irritable 1  Afraid - awful might happen 0  Total GAD 7 Score 3   Assessment & Plan:  This visit occurred during the SARS-CoV-2 public health emergency.  Safety protocols were in place, including screening questions prior to the visit, additional usage of staff PPE, and extensive cleaning of exam room while observing appropriate contact time as indicated for disinfecting solutions.   Problem List Items Addressed This Visit    GERD (gastroesophageal reflux disease)    Chronic, stable on PRN PPI - continue.       Relevant Medications   pantoprazole (PROTONIX) 40 MG tablet   Obesity, Class I, BMI 30-34.9    Reviewed healthy diet and lifestyle changes to affect sustainable weight loss.        Renal insufficiency    Mild decrease in GFR - encouraged increased water intake.       Relevant Orders   Renal function panel  Fatigue    Endorses fatigue, decreased libido, mild depressed mood.  Check T next am labs.       Relevant Orders   Testosterone   Vitamin B12   Health maintenance examination - Primary    Preventative protocols reviewed and updated unless pt declined. Discussed healthy diet and lifestyle.       Acquired hypothyroidism    Endorses fatigue, cold intolerance, trouble with weight loss.  Will trial levothyroxine 80mcg daily - reviewed correct administration of medication.  RTC 4-6 wks recheck TFTs.       Relevant Medications   levothyroxine (SYNTHROID) 50 MCG tablet   Other Relevant Orders   TSH   T4, free   Tinea corporis    Significant improvement on current regimen - finish diflucan.        Other Visit Diagnoses    Special screening for  malignant neoplasms, colon       Relevant Orders   Ambulatory referral to Gastroenterology       Meds ordered this encounter  Medications  . pantoprazole (PROTONIX) 40 MG tablet    Sig: Take 1 tablet (40 mg total) by mouth daily as needed (GERD).    Dispense:  30 tablet    Refill:  6  . levothyroxine (SYNTHROID) 50 MCG tablet    Sig: Take 1 tablet (50 mcg total) by mouth daily.    Dispense:  30 tablet    Refill:  6   Orders Placed This Encounter  Procedures  . TSH    Standing Status:   Future    Standing Expiration Date:   03/20/2022  . T4, free    Standing Status:   Future    Standing Expiration Date:   03/20/2022  . Testosterone    Standing Status:   Future    Standing Expiration Date:   03/20/2022  . Renal function panel    Standing Status:   Future    Standing Expiration Date:   03/20/2022  . Vitamin B12    Standing Status:   Future    Standing Expiration Date:   03/20/2022  . Ambulatory referral to Gastroenterology    Referral Priority:   Routine    Referral Type:   Consultation    Referral Reason:   Specialty Services Required    Number of Visits Requested:   1    Patient instructions: Consider shingrix vaccine series and covid shots.  Good to see you today Return as needed or in 1 year for next physical. Thyroid levels remaining borderline low. Trial thyroid replacement levothyroxine 40mcg daily - take first thing in the morning on an empty stomach and separate from other medicines by a few hours if able, separate from food by 30-60 minutes.  Return in 4-6 weeks for lab visit to recheck thyroid levels.   Follow up plan: Return in about 1 year (around 03/20/2022) for annual exam, prior fasting for blood work.  Ria Bush, MD

## 2021-03-20 NOTE — Assessment & Plan Note (Signed)
Significant improvement on current regimen - finish diflucan.

## 2021-03-20 NOTE — Assessment & Plan Note (Signed)
Endorses fatigue, cold intolerance, trouble with weight loss.  Will trial levothyroxine 89mcg daily - reviewed correct administration of medication.  RTC 4-6 wks recheck TFTs.

## 2021-03-20 NOTE — Assessment & Plan Note (Signed)
Chronic, stable on PRN PPI - continue.

## 2021-03-20 NOTE — Assessment & Plan Note (Signed)
Endorses fatigue, decreased libido, mild depressed mood.  Check T next am labs.

## 2021-03-22 ENCOUNTER — Other Ambulatory Visit: Payer: Self-pay

## 2021-03-22 DIAGNOSIS — Z1211 Encounter for screening for malignant neoplasm of colon: Secondary | ICD-10-CM

## 2021-03-22 MED ORDER — PEG 3350-KCL-NA BICARB-NACL 420 G PO SOLR: ORAL | 0 refills | Status: DC

## 2021-03-25 ENCOUNTER — Encounter: Payer: Self-pay | Admitting: Gastroenterology

## 2021-04-01 ENCOUNTER — Ambulatory Visit: Payer: BC Managed Care – PPO | Admitting: Anesthesiology

## 2021-04-01 ENCOUNTER — Encounter: Payer: Self-pay | Admitting: Gastroenterology

## 2021-04-01 ENCOUNTER — Encounter
Admission: RE | Disposition: A | Discharge status: Home / Self Care | Payer: Self-pay | Source: Home / Self Care | Attending: Gastroenterology

## 2021-04-01 ENCOUNTER — Ambulatory Visit
Admission: RE | Admit: 2021-04-01 | Discharge: 2021-04-01 | Disposition: A | Discharge status: Home / Self Care | Payer: BC Managed Care – PPO | Attending: Gastroenterology | Admitting: Gastroenterology

## 2021-04-01 ENCOUNTER — Other Ambulatory Visit: Payer: Self-pay

## 2021-04-01 DIAGNOSIS — K635 Polyp of colon: Secondary | ICD-10-CM

## 2021-04-01 DIAGNOSIS — Z833 Family history of diabetes mellitus: Secondary | ICD-10-CM | POA: Diagnosis not present

## 2021-04-01 DIAGNOSIS — Z8249 Family history of ischemic heart disease and other diseases of the circulatory system: Secondary | ICD-10-CM | POA: Insufficient documentation

## 2021-04-01 DIAGNOSIS — D123 Benign neoplasm of transverse colon: Secondary | ICD-10-CM | POA: Insufficient documentation

## 2021-04-01 DIAGNOSIS — Z79899 Other long term (current) drug therapy: Secondary | ICD-10-CM | POA: Diagnosis not present

## 2021-04-01 DIAGNOSIS — D124 Benign neoplasm of descending colon: Secondary | ICD-10-CM | POA: Insufficient documentation

## 2021-04-01 DIAGNOSIS — K641 Second degree hemorrhoids: Secondary | ICD-10-CM | POA: Diagnosis not present

## 2021-04-01 DIAGNOSIS — Z1211 Encounter for screening for malignant neoplasm of colon: Secondary | ICD-10-CM | POA: Diagnosis not present

## 2021-04-01 DIAGNOSIS — K219 Gastro-esophageal reflux disease without esophagitis: Secondary | ICD-10-CM | POA: Insufficient documentation

## 2021-04-01 HISTORY — PX: COLONOSCOPY WITH PROPOFOL: SHX5780

## 2021-04-01 HISTORY — PX: POLYPECTOMY: SHX5525

## 2021-04-01 SURGERY — COLONOSCOPY WITH PROPOFOL
Anesthesia: General | Site: Rectum

## 2021-04-01 MED ORDER — PROPOFOL 10 MG/ML IV BOLUS: INTRAVENOUS | Status: DC | PRN

## 2021-04-01 MED ORDER — LIDOCAINE HCL (CARDIAC) PF 100 MG/5ML IV SOSY
PREFILLED_SYRINGE | INTRAVENOUS | Status: DC | PRN
  Administered 2021-04-01: 60 mg via INTRAVENOUS

## 2021-04-01 MED ORDER — LACTATED RINGERS IV SOLN: INTRAVENOUS | Status: DC

## 2021-04-01 MED ORDER — STERILE WATER FOR IRRIGATION IR SOLN
Status: DC | PRN
  Administered 2021-04-01: 150 mL

## 2021-04-01 MED ORDER — PROPOFOL 10 MG/ML IV BOLUS
INTRAVENOUS | Status: DC | PRN
  Administered 2021-04-01 (×3): 30 mg via INTRAVENOUS
  Administered 2021-04-01: 80 mg via INTRAVENOUS
  Administered 2021-04-01: 20 mg via INTRAVENOUS
  Administered 2021-04-01: 40 mg via INTRAVENOUS

## 2021-04-01 MED ORDER — SODIUM CHLORIDE 0.9 % IV SOLN: INTRAVENOUS | Status: DC

## 2021-04-01 SURGICAL SUPPLY — 22 items
CLIP HMST 235XBRD CATH ROT (MISCELLANEOUS) IMPLANT
CLIP RESOLUTION 360 11X235 (MISCELLANEOUS)
ELECT REM PT RETURN 9FT ADLT (ELECTROSURGICAL)
ELECTRODE REM PT RTRN 9FT ADLT (ELECTROSURGICAL) IMPLANT
FORCEPS BIOP RAD 4 LRG CAP 4 (CUTTING FORCEPS) ×3 IMPLANT
GOWN CVR UNV OPN BCK APRN NK (MISCELLANEOUS) ×4 IMPLANT
GOWN ISOL THUMB LOOP REG UNIV (MISCELLANEOUS) ×6
INJECTOR VARIJECT VIN23 (MISCELLANEOUS) IMPLANT
KIT DEFENDO VALVE AND CONN (KITS) IMPLANT
KIT PRC NS LF DISP ENDO (KITS) ×2 IMPLANT
KIT PROCEDURE OLYMPUS (KITS) ×3
MANIFOLD NEPTUNE II (INSTRUMENTS) ×3 IMPLANT
MARKER SPOT ENDO TATTOO 5ML (MISCELLANEOUS) IMPLANT
PROBE APC STR FIRE (PROBE) IMPLANT
RETRIEVER NET ROTH 2.5X230 LF (MISCELLANEOUS) IMPLANT
SNARE COLD EXACTO (MISCELLANEOUS) ×3 IMPLANT
SNARE SHORT THROW 13M SML OVAL (MISCELLANEOUS) IMPLANT
SNARE SNG USE RND 15MM (INSTRUMENTS) IMPLANT
SPOT EX ENDOSCOPIC TATTOO (MISCELLANEOUS)
TRAP ETRAP POLY (MISCELLANEOUS) ×3 IMPLANT
VARIJECT INJECTOR VIN23 (MISCELLANEOUS)
WATER STERILE IRR 250ML POUR (IV SOLUTION) ×3 IMPLANT

## 2021-04-01 NOTE — Op Note (Signed)
[facility] Gastroenterology Patient Name: Eric Colon Procedure Date: 04/01/2021 8:43 AM MRN: [id number] Account #: [id number] Date of Birth: [birth year] Admit Type: Outpatient Age: 55 Room: [facility] OR ROOM 01 Gender: Male Note Status: Finalized Procedure:             Colonoscopy Indications:           Screening for colorectal malignant neoplasm Providers:             [name] MD, MD Referring MD:          Ria Bush (Referring MD) Medicines:             Propofol per Anesthesia Complications:         No immediate complications. Procedure:             Pre-Anesthesia Assessment:                        - Prior to the procedure, a History and Physical was                         performed, and patient medications and allergies were                         reviewed. The patient's tolerance of previous                         anesthesia was also reviewed. The risks and benefits                         of the procedure and the sedation options and risks                         were discussed with the patient. All questions were                         answered, and informed consent was obtained. Prior                         Anticoagulants: The patient has taken no previous                         anticoagulant or antiplatelet agents. ASA Grade                         Assessment: II - A patient with mild systemic disease.                         After reviewing the risks and benefits, the patient                         was deemed in satisfactory condition to undergo the                         procedure.                        After obtaining informed consent, the colonoscope was  passed under direct vision. Throughout the procedure,                         the patient's blood pressure, pulse, and oxygen                         saturations were monitored continuously. The                         Colonoscope was introduced through the anus  and                         advanced to the the terminal ileum. The colonoscopy                         was performed without difficulty. The patient                         tolerated the procedure well. The quality of the bowel                         preparation was excellent. Findings:      The perianal and digital rectal examinations were normal.      A 9 mm polyp was found in the transverse colon. The polyp was sessile.       The polyp was removed with a cold snare. Resection and retrieval were       complete.      A 4 mm polyp was found in the descending colon. The polyp was sessile.       The polyp was removed with a cold snare. Resection and retrieval were       complete.      A patchy area of mildly erythematous mucosa was found in the sigmoid       colon. This was biopsied with a cold forceps for histology.      Non-bleeding internal hemorrhoids were found during retroflexion. The       hemorrhoids were Grade II (internal hemorrhoids that prolapse but reduce       spontaneously).      The terminal ileum appeared normal. Impression:            - One 9 mm polyp in the transverse colon, removed with                         a cold snare. Resected and retrieved.                        - One 4 mm polyp in the descending colon, removed with                         a cold snare. Resected and retrieved.                        - Erythematous mucosa in the sigmoid colon. Biopsied.                        - Non-bleeding internal hemorrhoids.                        -  The examined portion of the ileum was normal. Recommendation:        - Discharge patient to home.                        - Resume previous diet.                        - Continue present medications.                        - Await pathology results.                        - Repeat colonoscopy in 7 years for surveillance if                         adenomatous sand 10 years if hyperplastic. Procedure Code(s):     ---  Professional ---                        [phone], Colonoscopy, flexible; with removal of                         tumor(s), polyp(s), or other lesion(s) by snare                         technique                        45380, 36, Colonoscopy, flexible; with biopsy, single                         or multiple Diagnosis Code(s):     --- Professional ---                        Z12.11, Encounter for screening for malignant neoplasm                         of colon                        K63.5, Polyp of colon CPT copyright 2019 American Medical Association. All rights reserved. The codes documented in this report are preliminary and upon coder review may  be revised to meet current compliance requirements. [name] MD, MD 04/01/2021 9:07:10 AM This report has been signed electronically. Number of Addenda: 0 Note Initiated On: 04/01/2021 8:43 AM Scope Withdrawal Time: 0 hours 8 minutes 29 seconds  Total Procedure Duration: 0 hours 9 minutes 40 seconds  Estimated Blood Loss:  Estimated blood loss: none.      [facility]

## 2021-04-01 NOTE — Anesthesia Procedure Notes (Signed)
Date/Time: 04/01/2021 8:50 AM Performed by: Dionne Bucy, CRNA Pre-anesthesia Checklist: Patient identified, Emergency Drugs available, Suction available, Patient being monitored and Timeout performed Patient Re-evaluated:Patient Re-evaluated prior to induction Oxygen Delivery Method: Simple face mask Induction Type: IV induction Placement Confirmation: positive ETCO2

## 2021-04-01 NOTE — Transfer of Care (Signed)
Immediate Anesthesia Transfer of Care Note  Patient: Eric Colon  Procedure(s) Performed: COLONOSCOPY WITH PROPOFOL  Patient Location: PACU  Anesthesia Type: General  Level of Consciousness: awake, alert  and patient cooperative  Airway and Oxygen Therapy: Patient Spontanous Breathing and Patient connected to supplemental oxygen  Post-op Assessment: Post-op Vital signs reviewed, Patient's Cardiovascular Status Stable, Respiratory Function Stable, Patent Airway and No signs of Nausea or vomiting  Post-op Vital Signs: Reviewed and stable  Complications: No notable events documented.

## 2021-04-01 NOTE — Anesthesia Preprocedure Evaluation (Signed)
Anesthesia Evaluation  Patient identified by MRN, date of birth, ID band Patient awake    Reviewed: Allergy & Precautions, NPO status , Patient's Chart, lab work & pertinent test results  Airway Mallampati: II  TM Distance: >3 FB Neck ROM: Full    Dental no notable dental hx.    Pulmonary neg pulmonary ROS,    Pulmonary exam normal        Cardiovascular Normal cardiovascular exam     Neuro/Psych  Headaches, Anxiety    GI/Hepatic GERD  Controlled,  Endo/Other  Hypothyroidism BMI 32  Renal/GU negative Renal ROS     Musculoskeletal negative musculoskeletal ROS (+)   Abdominal (+) + obese,   Peds  Hematology negative hematology ROS (+)   Anesthesia Other Findings   Reproductive/Obstetrics                            Anesthesia Physical Anesthesia Plan  ASA: 2  Anesthesia Plan: General   Post-op Pain Management:    Induction: Intravenous  PONV Risk Score and Plan: 2 and Propofol infusion, TIVA and Treatment may vary due to age or medical condition  Airway Management Planned: Natural Airway and Nasal Cannula  Additional Equipment:   Intra-op Plan:   Post-operative Plan:   Informed Consent: I have reviewed the patients History and Physical, chart, labs and discussed the procedure including the risks, benefits and alternatives for the proposed anesthesia with the patient or authorized representative who has indicated his/her understanding and acceptance.     Dental advisory given  Plan Discussed with: CRNA  Anesthesia Plan Comments:         Anesthesia Quick Evaluation

## 2021-04-01 NOTE — Anesthesia Postprocedure Evaluation (Signed)
Anesthesia Post Note  Patient: Eric Colon  Procedure(s) Performed: COLONOSCOPY WITH PROPOFOL POLYPECTOMY (Rectum)     Patient location during evaluation: PACU Anesthesia Type: General Level of consciousness: awake and alert Pain management: pain level controlled Vital Signs Assessment: post-procedure vital signs reviewed and stable Respiratory status: spontaneous breathing and nonlabored ventilation Cardiovascular status: blood pressure returned to baseline Postop Assessment: no apparent nausea or vomiting Anesthetic complications: no   No notable events documented.  Owen Pagnotta [employer]

## 2021-04-01 NOTE — H&P (Signed)
Eric Lame, MD [facility] [address] [place], [place] [address] Phone: [phone] Fax : [phone]  Primary Care Physician:  Ria Bush, MD Primary Gastroenterologist:  Dr. Allen Norris  Pre-Procedure History & Physical: HPI:  Eric Colon is a 55 y.o. male is here for a screening colonoscopy.   Past Medical History:  Diagnosis Date   GERD (gastroesophageal reflux disease)    Hemorrhoids    Migraines     Past Surgical History:  Procedure Laterality Date   INGUINAL HERNIA REPAIR Right 1990    Prior to Admission medications   Medication Sig Start Date End Date Taking? Authorizing Provider  fluconazole (DIFLUCAN) 150 MG tablet Take 1 tablet (150 mg total) by mouth once a week. 03/08/21  Yes Ria Bush, MD  levothyroxine (SYNTHROID) 50 MCG tablet Take 1 tablet (50 mcg total) by mouth daily. 03/20/21  Yes Ria Bush, MD  pantoprazole (PROTONIX) 40 MG tablet Take 1 tablet (40 mg total) by mouth daily as needed (GERD). 03/20/21  Yes Ria Bush, MD  polyethylene glycol-electrolytes (GAVILYTE-N WITH FLAVOR PACK) 420 g solution Drink one 8 oz glass every 20 mins until entire container is finished starting at 5:00pm on 03/31/21 03/22/21  Yes Kye Silverstein, MD  sildenafil (REVATIO) 20 MG tablet TAKE 2-3 TABLETS BY MOUTH ONCE DAILY AS NEEDED FOR RELATIONS 01/31/21  Yes Ria Bush, MD    Allergies as of 03/22/2021   (No Known Allergies)    Family History  Problem Relation Age of Onset   Cancer Mother 69       lung (smoker)   Cancer Father 64       prostate?   Diabetes Maternal Grandmother    CAD Neg Hx    Stroke Neg Hx     Social History   Socioeconomic History   Marital status: Married    Spouse name: Not on file   Number of children: Not on file   Years of education: Not on file   Highest education level: Not on file  Occupational History   Not on file  Tobacco Use   Smoking status: Never   Smokeless tobacco: Never  Vaping Use    Vaping Use: Never used  Substance and Sexual Activity   Alcohol use: Yes    Alcohol/week: 0.0 standard drinks    Comment: 2 beers/week   Drug use: No   Sexual activity: Not on file  Other Topics Concern   Not on file  Social History Narrative   Lives with wife, grown children   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 Neck:  Supple; no masses or thyromegaly. Lungs:  Clear throughout to auscultation.    Heart:  Regular rate and rhythm. Abdomen:  Soft, nontender and nondistended. Normal bowel sounds, without guarding, and without rebound.   Neurologic:  Alert  and  oriented x4;  grossly normal neurologically.  Impression/Plan: Nolon Nations is now here to undergo a screening colonoscopy.  Risks, benefits, and alternatives regarding colonoscopy have been reviewed with the patient.  Questions have been answered.  All parties agreeable.

## 2021-04-02 ENCOUNTER — Encounter: Payer: Self-pay | Admitting: Gastroenterology

## 2021-04-04 LAB — SURGICAL PATHOLOGY

## 2021-04-06 ENCOUNTER — Encounter: Payer: Self-pay | Admitting: Family Medicine

## 2021-04-08 ENCOUNTER — Encounter: Payer: Self-pay | Admitting: Gastroenterology

## 2021-04-24 ENCOUNTER — Other Ambulatory Visit: Payer: BC Managed Care – PPO

## 2021-05-20 ENCOUNTER — Telehealth: Payer: Self-pay

## 2021-05-20 MED ORDER — FLUCONAZOLE 150 MG PO TABS: 150.0000 mg | ORAL_TABLET | ORAL | 0 refills | Status: DC

## 2021-05-20 NOTE — Telephone Encounter (Signed)
ERx 

## 2021-05-20 NOTE — Telephone Encounter (Signed)
Pt is stating he is having another breakout like he did back in May. Fluconazole worked well last time. Asking if he can get another round of it. [place]

## 2021-05-21 NOTE — Telephone Encounter (Signed)
Made pt aware by phn.  Verbalizes understanding.

## 2021-08-07 ENCOUNTER — Telehealth: Payer: Self-pay

## 2021-08-07 DIAGNOSIS — E039 Hypothyroidism, unspecified: Secondary | ICD-10-CM

## 2021-08-07 MED ORDER — FLUCONAZOLE 150 MG PO TABS
150.0000 mg | ORAL_TABLET | ORAL | 0 refills | Status: DC
Start: 2021-08-07 — End: 2021-10-17

## 2021-08-07 NOTE — Telephone Encounter (Signed)
ERx. 3rd treatment in 5 months.  Schedule OV next time this recurs for further eval including labs (would need to check LFTs).

## 2021-08-07 NOTE — Telephone Encounter (Signed)
Patient states he is getting another fungal outbreak on his buttocks but not a severe as before but wanted to ask if he could get Floconazole sent in like before to help him.

## 2021-08-07 NOTE — Telephone Encounter (Signed)
Patient advised. Pharmacy advised. Lab appointment made

## 2021-08-07 NOTE — Telephone Encounter (Signed)
Received a fax from [facility] asking if it is ok to change manufacturer of Levothyroxine medication

## 2021-08-07 NOTE — Telephone Encounter (Signed)
Ok to change however plz notify patient as well - will need to schedule lab visit for thyroid check 2 months after changing manufacturers. Labs have been ordered.

## 2021-08-08 NOTE — Telephone Encounter (Signed)
Lvm asking pt to call back.  Need to relay Dr. G's message.  

## 2021-08-09 NOTE — Telephone Encounter (Signed)
Lvm asking pt to call back.  Need to relay Dr. G's message.  

## 2021-08-13 NOTE — Telephone Encounter (Signed)
Lvm asking pt to call back.  Need to relay Dr. G's message.  

## 2021-08-14 NOTE — Telephone Encounter (Signed)
Spoke with pt relaying Dr. G's message. Pt verbalizes understanding.  

## 2021-10-09 ENCOUNTER — Other Ambulatory Visit: Payer: BC Managed Care – PPO

## 2021-10-16 ENCOUNTER — Telehealth: Payer: Self-pay | Admitting: Family Medicine

## 2021-10-16 NOTE — Telephone Encounter (Signed)
Pt called stating that he has a bad yeast on his butt. Pt states that he scratched it so much because it was itching so bad that now its burning. Pt states that he is going out of town on 10/21/21 and there is no available appt. Pt is asking if you can call in medication fluconazole (DIFLUCAN) 150 MG tablet to [facility] in [place] on [address]. Please advise.

## 2021-10-17 ENCOUNTER — Other Ambulatory Visit: Payer: Self-pay | Admitting: Family Medicine

## 2021-10-17 MED ORDER — FLUCONAZOLE 150 MG PO TABS: 150.0000 mg | ORAL_TABLET | ORAL | 0 refills | Status: DC

## 2021-10-17 NOTE — Addendum Note (Signed)
Addended by: Ria Bush on: 10/17/2021 09:05 PM   Modules accepted: Orders

## 2021-10-17 NOTE — Telephone Encounter (Addendum)
Rx diflucan 3 wk course.  Let us know if not improved to consider dermatology referral.

## 2021-10-18 NOTE — Telephone Encounter (Signed)
Spoke to pt and notified him of RX sent in. Pt will discuss with PCP on next OV if no improvement.

## 2021-11-08 ENCOUNTER — Encounter: Payer: Self-pay | Admitting: Family Medicine

## 2021-11-08 ENCOUNTER — Other Ambulatory Visit: Payer: Self-pay

## 2021-11-08 ENCOUNTER — Ambulatory Visit (INDEPENDENT_AMBULATORY_CARE_PROVIDER_SITE_OTHER): Payer: Self-pay | Admitting: Family Medicine

## 2021-11-08 VITALS — BP 124/80 | HR 74 | Temp 97.3°F | Ht 71.0 in | Wt 251.4 lb

## 2021-11-08 DIAGNOSIS — N529 Male erectile dysfunction, unspecified: Secondary | ICD-10-CM

## 2021-11-08 DIAGNOSIS — E039 Hypothyroidism, unspecified: Secondary | ICD-10-CM

## 2021-11-08 DIAGNOSIS — B356 Tinea cruris: Secondary | ICD-10-CM

## 2021-11-08 DIAGNOSIS — B354 Tinea corporis: Secondary | ICD-10-CM

## 2021-11-08 DIAGNOSIS — R5383 Other fatigue: Secondary | ICD-10-CM

## 2021-11-08 LAB — TESTOSTERONE: Testosterone: 331.03 ng/dL (ref 300.00–890.00)

## 2021-11-08 LAB — COMPREHENSIVE METABOLIC PANEL
ALT: 28 U/L (ref 0–53)
AST: 14 U/L (ref 0–37)
Albumin: 4.4 g/dL (ref 3.5–5.2)
Alkaline Phosphatase: 50 U/L (ref 39–117)
BUN: 14 mg/dL (ref 6–23)
CO2: 30 mEq/L (ref 19–32)
Calcium: 9 mg/dL (ref 8.4–10.5)
Chloride: 105 mEq/L (ref 96–112)
Creatinine, Ser: 1.43 mg/dL (ref 0.40–1.50)
GFR: 55.7 mL/min — ABNORMAL LOW (ref >60.00)
Glucose, Bld: 96 mg/dL (ref 70–99)
Potassium: 4.7 mEq/L (ref 3.5–5.1)
Sodium: 140 mEq/L (ref 135–145)
Total Bilirubin: 0.7 mg/dL (ref 0.2–1.2)
Total Protein: 6.7 g/dL (ref 6.0–8.3)

## 2021-11-08 LAB — TSH: TSH: 3.35 u[IU]/mL (ref 0.35–5.50)

## 2021-11-08 LAB — VITAMIN B12: Vitamin B-12: 501 pg/mL (ref 211–911)

## 2021-11-08 LAB — T4, FREE: Free T4: 0.96 ng/dL (ref 0.60–1.60)

## 2021-11-08 MED ORDER — SILDENAFIL CITRATE 50 MG PO TABS: 50.0000 mg | ORAL_TABLET | Freq: Every day | ORAL | 3 refills | Status: DC | PRN

## 2021-11-08 MED ORDER — FLUCONAZOLE 200 MG PO TABS: 200.0000 mg | ORAL_TABLET | ORAL | 2 refills | Status: DC

## 2021-11-08 NOTE — Assessment & Plan Note (Signed)
Longstanding chronic recurrent fungal skin rash predominantly to buttocks/groin, previously KOH positive.  He finds oral antifungal previously terbinafine and currently diflucan 150mg  weekly course effectively controls rash however it quickly recurs once he ends 3-4 wk course.  Will check LFTs today, then refill diflucan at 200mg  weekly dose. Given longevity of symptoms will also refer to dermatology for re evaluation.  Pt agrees with plan.

## 2021-11-08 NOTE — Progress Notes (Signed)
Patient ID: Eric Colon, male    DOB: Aug 12, 1966, 56 y.o.   MRN: [id number]  This visit was conducted in person.  BP 124/80    Pulse 74    Temp (!) 97.3 F (36.3 C) (Temporal)    Ht 5\' 11"  (1.803 m)    Wt 251 lb 7 oz (114.1 kg)    SpO2 98%    BMI 35.07 kg/m    CC: 6 mo f/u visit  Subjective:   HPI: Eric Colon is a 56 y.o. male presenting on 11/08/2021 for Hypothyroidism (Here for f/u. ) and Discuss Medication (Wants to discuss sildenafil and fluconazole.)   Hypothyroidism on levothyroxine 31mcg daily. Formulary change end of 07/2021 - due for repeat labwork. Weight gain noted. Staying somewhat fatigued. No hair changes, heat or cold intolerance, diarrhea/constipation. Tolerating med well, takes first thing in the morning on an empty stomach with a glass of water separate from other medications and supplements.   Recurrent skin rash presumed tinea corporis infection - most effective treatment to date has been course of oral diflucan (150mg  weekly x2-3 wks). 4 flares over the past 6 months. Recurrent skin infections despite selsun blue use, vinegar baths. Last diflucan 150mg  dose was 11/01/2021.      Relevant past medical, surgical, family and social history reviewed and updated as indicated. Interim medical history since our last visit reviewed. Allergies and medications reviewed and updated. Outpatient Medications Prior to Visit  Medication Sig Dispense Refill   levothyroxine (SYNTHROID) 50 MCG tablet Take 1 tablet (50 mcg total) by mouth daily. 30 tablet 6   pantoprazole (PROTONIX) 40 MG tablet Take 1 tablet (40 mg total) by mouth daily as needed (GERD). 30 tablet 6   fluconazole (DIFLUCAN) 150 MG tablet Take 1 tablet (150 mg total) by mouth once a week. 3 tablet 0   sildenafil (REVATIO) 20 MG tablet TAKE 2-3 TABLETS BY MOUTH ONCE DAILY AS NEEDED FOR RELATIONS 30 tablet 3   polyethylene glycol-electrolytes (GAVILYTE-N WITH FLAVOR PACK) 420 g solution Drink one 8 oz glass every  20 mins until entire container is finished starting at 5:00pm on 03/31/21 4000 mL 0   No facility-administered medications prior to visit.     Per HPI unless specifically indicated in ROS section below Review of Systems  Objective:  BP 124/80    Pulse 74    Temp (!) 97.3 F (36.3 C) (Temporal)    Ht 5\' 11"  (1.803 m)    Wt 251 lb 7 oz (114.1 kg)    SpO2 98%    BMI 35.07 kg/m   Wt Readings from Last 3 Encounters:  11/08/21 251 lb 7 oz (114.1 kg)  04/01/21 230 lb (104.3 kg)  03/20/21 234 lb 2 oz (106.2 kg)      Physical Exam Vitals and nursing note reviewed.  Constitutional:      Appearance: Normal appearance. He is obese. He is not ill-appearing.  Cardiovascular:     Rate and Rhythm: Normal rate and regular rhythm.     Pulses: Normal pulses.     Heart sounds: Normal heart sounds. No murmur heard. Pulmonary:     Effort: Pulmonary effort is normal. No respiratory distress.     Breath sounds: Normal breath sounds. No wheezing, rhonchi or rales.  Abdominal:     General: Bowel sounds are normal. There is no distension.     Palpations: Abdomen is soft. There is no hepatomegaly, splenomegaly or mass.     Tenderness: There  is no abdominal tenderness. There is no guarding or rebound.     Hernia: No hernia is present.  Musculoskeletal:     Right lower leg: No edema.     Left lower leg: No edema.  Skin:    General: Skin is warm and dry.     Findings: Rash present.     Comments: Mild pruritic papular rash to R>L buttock and to skin fold at buttock  Neurological:     Mental Status: He is alert.  Psychiatric:        Mood and Affect: Mood normal.        Behavior: Behavior normal.       Assessment & Plan:  This visit occurred during the SARS-CoV-2 public health emergency.  Safety protocols were in place, including screening questions prior to the visit, additional usage of staff PPE, and extensive cleaning of exam room while observing appropriate contact time as indicated for  disinfecting solutions.   Problem List Items Addressed This Visit     Tinea cruris    Recurrent longstanding history. Has previously seen derm.      Relevant Medications   fluconazole (DIFLUCAN) 200 MG tablet   Other Relevant Orders   Ambulatory referral to Dermatology   Fatigue   Acquired hypothyroidism    Update TFTs after recent formulary change. He continues regular levothyroxine 63mcg daily.  20 lb weight gain noted since changing formulary.       Erectile dysfunction    Finds generic sildenafil 40-60mg  effective but causes headache. He notes he seemed to better tolerate viagra 50mg  dose - will refill this instead.       Tinea corporis - Primary    Longstanding chronic recurrent fungal skin rash predominantly to buttocks/groin, previously KOH positive.  He finds oral antifungal previously terbinafine and currently diflucan 150mg  weekly course effectively controls rash however it quickly recurs once he ends 3-4 wk course.  Will check LFTs today, then refill diflucan at 200mg  weekly dose. Given longevity of symptoms will also refer to dermatology for re evaluation.  Pt agrees with plan.       Relevant Medications   fluconazole (DIFLUCAN) 200 MG tablet   Other Relevant Orders   Comprehensive metabolic panel   Ambulatory referral to Dermatology     Meds ordered this encounter  Medications   sildenafil (VIAGRA) 50 MG tablet    Sig: Take 1 tablet (50 mg total) by mouth daily as needed for erectile dysfunction.    Dispense:  10 tablet    Refill:  3   fluconazole (DIFLUCAN) 200 MG tablet    Sig: Take 1 tablet (200 mg total) by mouth once a week.    Dispense:  4 tablet    Refill:  2   Orders Placed This Encounter  Procedures   Comprehensive metabolic panel   Ambulatory referral to Dermatology    Referral Priority:   Routine    Referral Type:   Consultation    Referral Reason:   Specialty Services Required    Requested Specialty:   Dermatology    Number of Visits  Requested:   1    Patient Instructions  Labs today.  Diflucan refilled - take 200mg  weekly course for 4 weeks, with refill if needed.  We will refer you back to dermatology for further evaluation of recurrent rash.   Follow up plan: Return if symptoms worsen or fail to improve.  Ria Bush, MD

## 2021-11-08 NOTE — Assessment & Plan Note (Signed)
Finds generic sildenafil 40-60mg  effective but causes headache. He notes he seemed to better tolerate viagra 50mg  dose - will refill this instead.

## 2021-11-08 NOTE — Assessment & Plan Note (Addendum)
Recurrent longstanding history. Has previously seen derm.

## 2021-11-08 NOTE — Patient Instructions (Addendum)
Labs today.  Diflucan refilled - take 200mg  weekly course for 4 weeks, with refill if needed.  We will refer you back to dermatology for further evaluation of recurrent rash.

## 2021-11-08 NOTE — Assessment & Plan Note (Addendum)
Update TFTs after recent formulary change. He continues regular levothyroxine 69mcg daily.  20 lb weight gain noted since changing formulary.

## 2021-11-21 ENCOUNTER — Other Ambulatory Visit: Payer: Self-pay | Admitting: Family Medicine

## 2021-11-21 DIAGNOSIS — R7989 Other specified abnormal findings of blood chemistry: Secondary | ICD-10-CM

## 2021-12-02 ENCOUNTER — Other Ambulatory Visit (INDEPENDENT_AMBULATORY_CARE_PROVIDER_SITE_OTHER): Payer: Self-pay

## 2021-12-02 ENCOUNTER — Other Ambulatory Visit: Payer: Self-pay

## 2021-12-02 DIAGNOSIS — R7989 Other specified abnormal findings of blood chemistry: Secondary | ICD-10-CM

## 2021-12-10 LAB — TESTOS,TOTAL,FREE AND SHBG (FEMALE)
Free Testosterone: 109.8 pg/mL (ref 35.0–155.0)
Sex Hormone Binding: 33 nmol/L (ref 10–50)
Testosterone, Total, LC-MS-MS: 581 ng/dL (ref 250–1100)

## 2022-01-15 ENCOUNTER — Ambulatory Visit (INDEPENDENT_AMBULATORY_CARE_PROVIDER_SITE_OTHER): Payer: Self-pay | Admitting: Dermatology

## 2022-01-15 DIAGNOSIS — B354 Tinea corporis: Secondary | ICD-10-CM

## 2022-01-15 MED ORDER — FLUCONAZOLE 200 MG PO TABS: 200.0000 mg | ORAL_TABLET | Freq: Every day | ORAL | 1 refills | Status: DC

## 2022-01-15 MED ORDER — KETOCONAZOLE 2 % EX CREA: TOPICAL_CREAM | CUTANEOUS | 2 refills | Status: DC

## 2022-01-15 NOTE — Patient Instructions (Signed)

## 2022-01-15 NOTE — Progress Notes (Signed)
? ?  New Patient Visit ? ?Subjective  ?Eric Colon is a 56 y.o. male who presents for the following: Rash (Pt c/o itchy irritating skin rash on his buttock comes and go for ~10 years, pt taking Diflucan 200 mg 1 tablet a week x 12 weeks,). Past treatment Lamisil tablets for several years helped, otc selsun blue shampoo.  ? ?The following portions of the chart were reviewed this encounter and updated as appropriate:  ? Tobacco  Allergies  Meds  Problems  Med Hx  Surg Hx  Fam Hx   ?  ?Review of Systems:  No other skin or systemic complaints except as noted in HPI or Assessment and Plan. ? ?Objective  ?Well appearing patient in no apparent distress; mood and affect are within normal limits. ? ?A focused examination was performed including buttock,groin. Relevant physical exam findings are noted in the Assessment and Plan. ? ?buttock ?Annular pink patches with scale and edema mainly on the right buttock ? ? ?Assessment & Plan  ?Tinea corporis ?With history of aggressive systemic treatments including oral terbinafine and oral fluconazole and topical antifungal treatments.  He continues to have recurrence after he decreases these treatments within days.  He has been on long-term treatment with these medications without results. ?buttock ? ?Chronic and persistent condition with duration or expected duration over one year. Condition is symptomatic / bothersome to patient. Not to goal.  ? ?Increase Diflucan 200 mg take 1 tablet daily x 1 month, if any side effects may decrease to 3 days a week ?Topical ketoconazole cream daily ? ?May consider Culture, biopsy or consultation at [facility] such as [facility] or [facility] in the future if not improving, due to long history of Tinea on the buttock.  ? ?Related Medications ?fluconazole (DIFLUCAN) 200 MG tablet ?Take 1 tablet (200 mg total) by mouth daily. ? ?ketoconazole (NIZORAL) 2 % cream ?Apply to skin daily ? ?Return in about 1 month (around 02/14/2022) for Tinea  Corporis . ? ?I, Marye Round, CMA, am acting as scribe for Sarina Ser, MD .  ?Documentation: I have reviewed the above documentation for accuracy and completeness, and I agree with the above. ? ?Sarina Ser, MD ? ?

## 2022-01-16 ENCOUNTER — Encounter: Payer: Self-pay | Admitting: Dermatology

## 2022-02-17 ENCOUNTER — Ambulatory Visit: Payer: Self-pay | Admitting: Dermatology

## 2022-08-08 IMAGING — DX DG SHOULDER 2+V*L*
3 series · 3 of 3 positions shown · non-contrast
Comparison: None

CLINICAL DATA: LEFT shoulder pain, chronic

EXAM:
LEFT SHOULDER - 2+ VIEW

[shoulder axial]
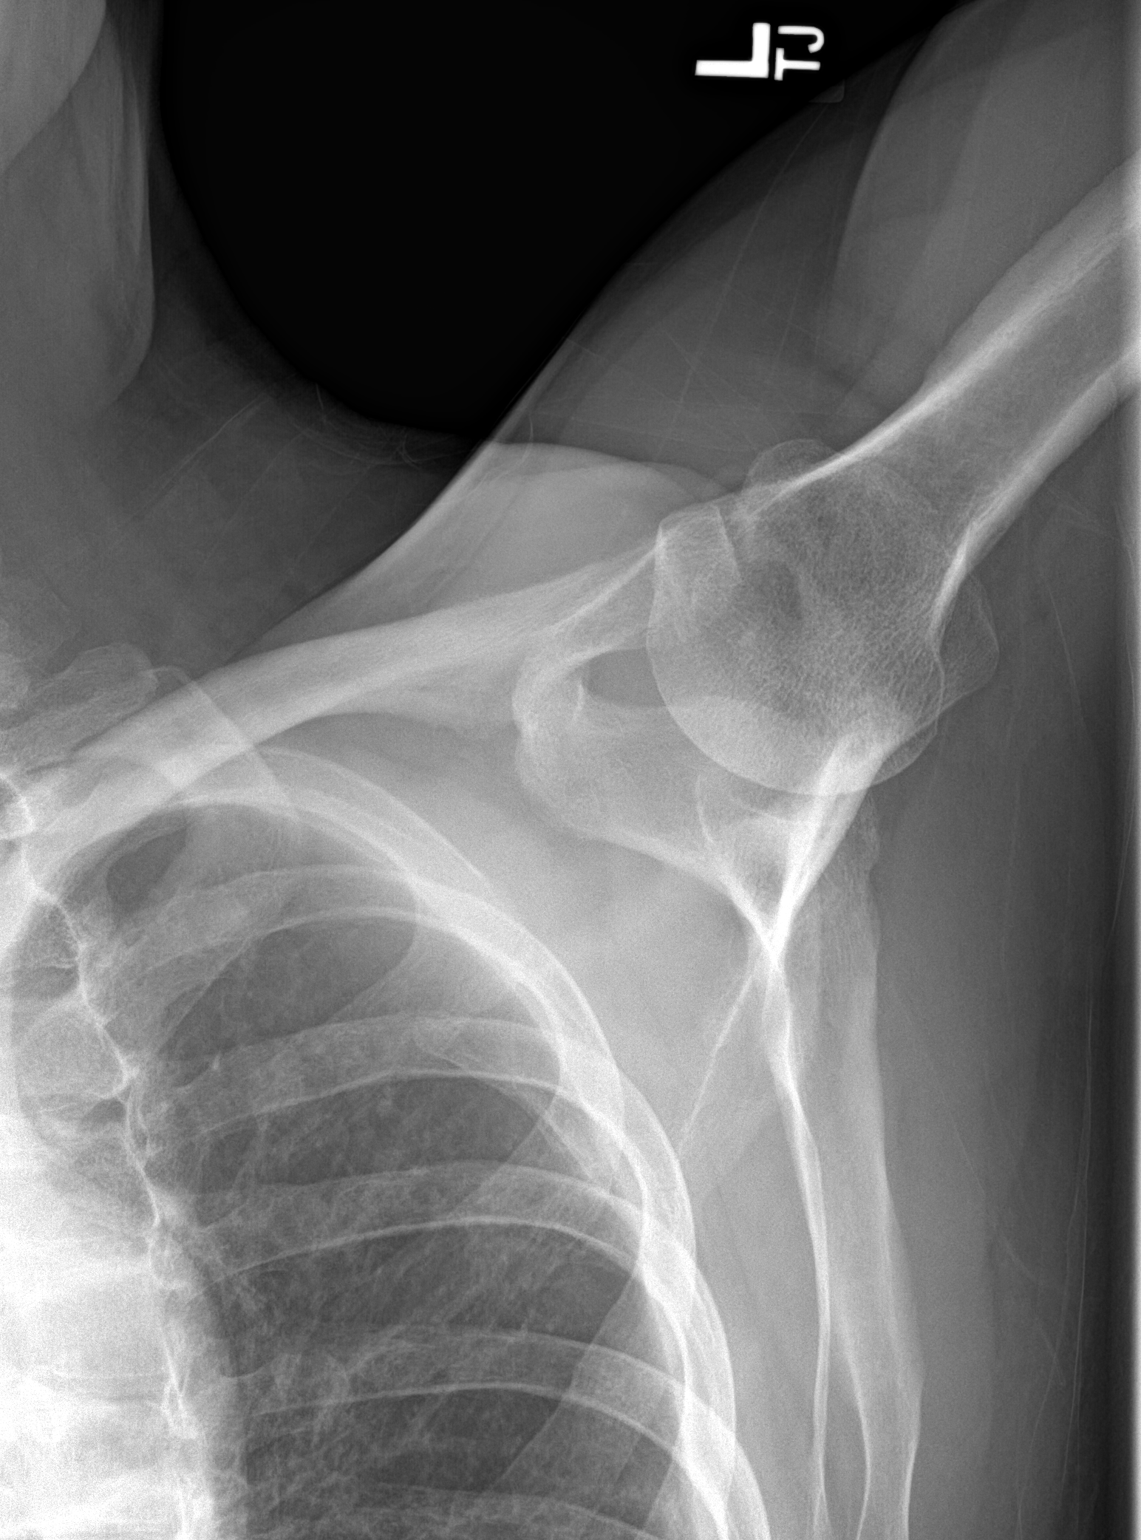

[shoulder obl]
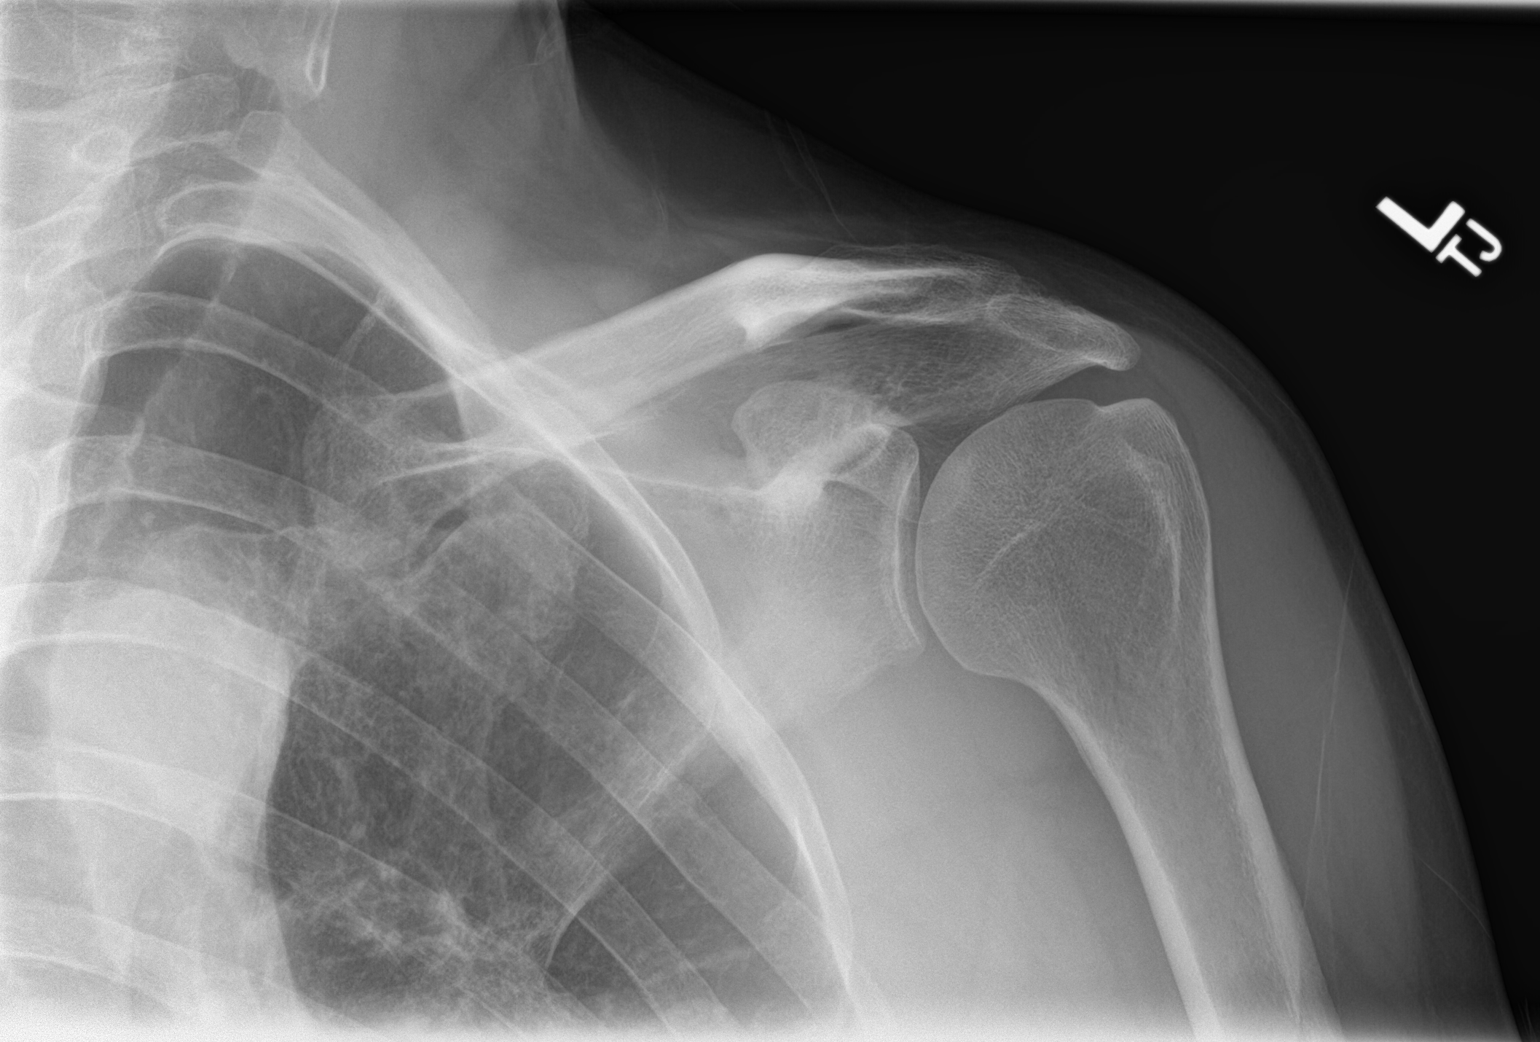

[shoulder y-view]
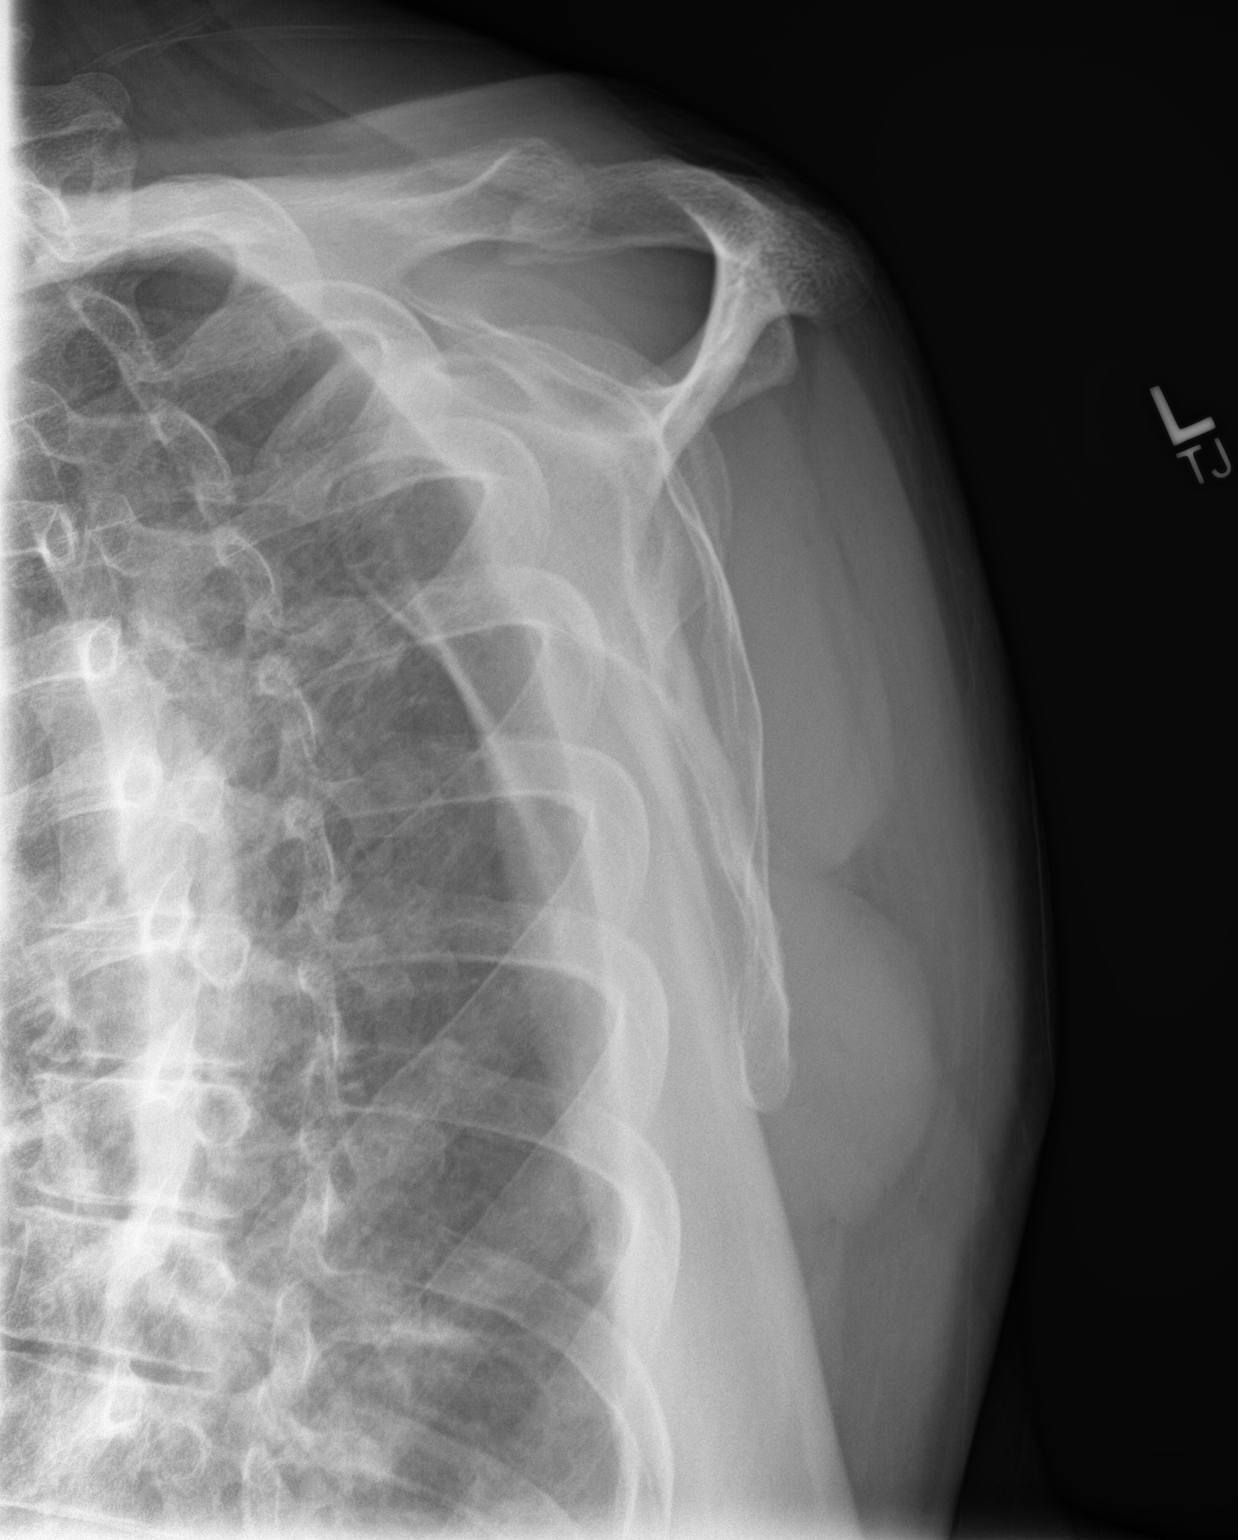

[3 of 3 positions shown; findings below may reference images not displayed]

FINDINGS: Osseous mineralization normal.

Visualized LEFT ribs intact.

AC joint alignment normal.

No acute fracture, dislocation, or bone destruction.
IMPRESSION: Normal exam.

## 2022-11-21 ENCOUNTER — Telehealth: Payer: Self-pay | Admitting: Family Medicine

## 2022-11-21 DIAGNOSIS — N529 Male erectile dysfunction, unspecified: Secondary | ICD-10-CM

## 2022-11-21 MED ORDER — SILDENAFIL CITRATE 50 MG PO TABS: 50.0000 mg | ORAL_TABLET | Freq: Every day | ORAL | 1 refills | Status: DC | PRN

## 2022-11-21 NOTE — Telephone Encounter (Signed)
Prescription Request  11/21/2022  Is this a "Controlled Substance" medicine? No  LOV: Visit date not found  What is the name of the medication or equipment? sildenafil (VIAGRA) 50 MG tablet   Have you contacted your pharmacy to request a refill? No   Which pharmacy would you like this sent to?    [facility], [place] [address] Phone: [phone] Fax: [phone]    Patient notified that their request is being sent to the clinical staff for review and that they should receive a response within 2 business days.   Please advise at Mobile [phone] (mobile)

## 2022-11-21 NOTE — Telephone Encounter (Signed)
E-scribed refill.  Plz schedule CPE and lab visits for additional refills.  

## 2022-11-24 NOTE — Telephone Encounter (Signed)
Spoke to pt, pt stated he'd give [place] a call back to scheduled cpe. Let pt know Dr. Darnell Level is booked out until May 2024 for cpe

## 2023-05-28 ENCOUNTER — Encounter (INDEPENDENT_AMBULATORY_CARE_PROVIDER_SITE_OTHER): Payer: Self-pay

## 2023-07-01 ENCOUNTER — Encounter: Payer: Self-pay | Admitting: Family Medicine

## 2023-07-01 ENCOUNTER — Ambulatory Visit (INDEPENDENT_AMBULATORY_CARE_PROVIDER_SITE_OTHER): Payer: Self-pay | Admitting: Family Medicine

## 2023-07-01 VITALS — BP 114/72 | HR 67 | Temp 95.2°F | Ht 71.0 in | Wt 201.0 lb

## 2023-07-01 DIAGNOSIS — E663 Overweight: Secondary | ICD-10-CM

## 2023-07-01 DIAGNOSIS — R5383 Other fatigue: Secondary | ICD-10-CM

## 2023-07-01 DIAGNOSIS — B354 Tinea corporis: Secondary | ICD-10-CM

## 2023-07-01 DIAGNOSIS — N529 Male erectile dysfunction, unspecified: Secondary | ICD-10-CM

## 2023-07-01 DIAGNOSIS — Z125 Encounter for screening for malignant neoplasm of prostate: Secondary | ICD-10-CM

## 2023-07-01 DIAGNOSIS — Z0001 Encounter for general adult medical examination with abnormal findings: Secondary | ICD-10-CM

## 2023-07-01 DIAGNOSIS — K649 Unspecified hemorrhoids: Secondary | ICD-10-CM

## 2023-07-01 DIAGNOSIS — E039 Hypothyroidism, unspecified: Secondary | ICD-10-CM

## 2023-07-01 DIAGNOSIS — E559 Vitamin D deficiency, unspecified: Secondary | ICD-10-CM

## 2023-07-01 DIAGNOSIS — K219 Gastro-esophageal reflux disease without esophagitis: Secondary | ICD-10-CM

## 2023-07-01 DIAGNOSIS — Z0000 Encounter for general adult medical examination without abnormal findings: Secondary | ICD-10-CM

## 2023-07-01 DIAGNOSIS — Z113 Encounter for screening for infections with a predominantly sexual mode of transmission: Secondary | ICD-10-CM

## 2023-07-01 LAB — COMPREHENSIVE METABOLIC PANEL WITH GFR
ALT: 17 U/L (ref 0–53)
AST: 13 U/L (ref 0–37)
Albumin: 4.5 g/dL (ref 3.5–5.2)
Alkaline Phosphatase: 47 U/L (ref 39–117)
BUN: 18 mg/dL (ref 6–23)
CO2: 31 meq/L (ref 19–32)
Calcium: 9.3 mg/dL (ref 8.4–10.5)
Chloride: 101 meq/L (ref 96–112)
Creatinine, Ser: 1.46 mg/dL (ref 0.40–1.50)
GFR: 53.7 mL/min — ABNORMAL LOW (ref >60.00)
Glucose, Bld: 73 mg/dL (ref 70–99)
Potassium: 4.2 meq/L (ref 3.5–5.1)
Sodium: 138 meq/L (ref 135–145)
Total Bilirubin: 0.8 mg/dL (ref 0.2–1.2)
Total Protein: 6.8 g/dL (ref 6.0–8.3)

## 2023-07-01 LAB — LIPID PANEL
Cholesterol: 194 mg/dL (ref 0–200)
HDL: 57 mg/dL (ref >39.00)
LDL Cholesterol: 118 mg/dL — ABNORMAL HIGH (ref 0–99)
NonHDL: 136.57
Total CHOL/HDL Ratio: 3
Triglycerides: 91 mg/dL (ref 0.0–149.0)
VLDL: 18.2 mg/dL (ref 0.0–40.0)

## 2023-07-01 LAB — T4, FREE: Free T4: 0.99 ng/dL (ref 0.60–1.60)

## 2023-07-01 LAB — VITAMIN D 25 HYDROXY (VIT D DEFICIENCY, FRACTURES): VITD: 48.58 ng/mL (ref 30.00–100.00)

## 2023-07-01 LAB — TSH: TSH: 3.04 u[IU]/mL (ref 0.35–5.50)

## 2023-07-01 LAB — VITAMIN B12: Vitamin B-12: 560 pg/mL (ref 211–911)

## 2023-07-01 LAB — PSA: PSA: 4.75 ng/mL — ABNORMAL HIGH (ref 0.10–4.00)

## 2023-07-01 MED ORDER — TADALAFIL 5 MG PO TABS: 5.0000 mg | ORAL_TABLET | Freq: Every day | ORAL | 3 refills | Status: DC

## 2023-07-01 NOTE — Assessment & Plan Note (Signed)
Preventative protocols reviewed and updated unless pt declined. Discussed healthy diet and lifestyle.  New sexual partners - update STD screen

## 2023-07-01 NOTE — Progress Notes (Unsigned)
Ph: [phone] Fax: [phone]   Patient ID: Eric Colon, male    DOB: Feb 23, 1966, 57 y.o.   MRN: [id number]  This visit was conducted in person.  BP 114/72   Pulse 67   Temp (!) 95.2 F (35.1 C)   Ht 5\' 11"  (1.803 m)   Wt 201 lb (91.2 kg)   SpO2 98%   BMI 28.03 kg/m    No results found.  CC: CPE Subjective:   HPI: Eric Colon is a 57 y.o. male presenting on 07/01/2023 for Annual Exam (Pt is not fasting. )   50 lb weight loss in the past 18 months - [place]. 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 Recent vacation trip to [place]. He's planning to re establish with them. He was also started on cialis 5mg  daily. This has helped ED symptoms. Also notes this has helped urinary stream. Has had new sexual partners.   Interested in vasectomy - planning [place] go through [place]rologist [place].  Agrees to STD screen, multiple partners in the past year.   Tinea corporis to buttock saw dermatology last 01/2022 treated with diflucan 200mg  daily x 1 month. Notes ongoing trouble.   Notes intermittent clear anal discharge attributed to inflamed internal hemorrhoids, rare blood. This is chronic issue. UTD colonoscopy 2022.   Off thyroid replacement for the past year - will recheck levels.   Preventative: COLONOSCOPY WITH PROPOFOL 04/01/2021 - TAs, rpt 24yrs Servando Snare, Darren, MD) Prostate cancer screening - no prostate symptoms or fmhx - yearly PSA  Lung cancer screen - not eligible  Flu shot - declines  COVID vaccine - discussed, declined  Tdap 10/2016  Shingrix - discussed, to consider  Seat belt use discussed Sunscreen use discussed. No changing moles on skin.  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 Interim medical history since our last visit reviewed. Allergies and medications reviewed and updated. Outpatient Medications Prior to Visit  Medication Sig Dispense Refill   fluconazole (DIFLUCAN) 200 MG tablet Take 1 tablet (200 mg total) by mouth daily. 30 tablet 1   ketoconazole (NIZORAL) 2 % cream Apply to skin daily 60 g 2   pantoprazole (PROTONIX) 40 MG tablet Take 1 tablet (40 mg total) by mouth daily as needed (GERD). 30 tablet 6   sildenafil (VIAGRA) 50 MG tablet Take 1 tablet (50 mg total) by mouth daily as needed for erectile dysfunction. 10 tablet 1   fluconazole (DIFLUCAN) 200 MG tablet Take 1 tablet (200 mg total) by mouth once a week. 4 tablet 2   levothyroxine (SYNTHROID) 50 MCG tablet Take 1 tablet (50 mcg total) by mouth daily. (Patient not taking: Reported on 07/01/2023) 30 tablet 6   No facility-administered medications prior to visit.     Per HPI unless specifically indicated in ROS section below Review of Systems  Constitutional:  Negative for activity change,  appetite change, chills, fatigue, fever and unexpected weight change.  HENT:  Negative for hearing loss.   Eyes:  Negative for visual disturbance.  Respiratory:  Negative for cough, chest tightness, shortness of breath and wheezing.   Cardiovascular:  Negative for chest pain, palpitations and leg swelling.  Gastrointestinal:  Positive for blood in stool (intermittent - with wiping). Negative for abdominal distention, abdominal pain, constipation, diarrhea, nausea and vomiting.  Genitourinary:  Negative for difficulty urinating and hematuria.  Musculoskeletal:  Negative for  arthralgias, myalgias and neck pain.  Skin:  Negative for rash.  Neurological:  Negative for dizziness, seizures, syncope and headaches.  Hematological:  Negative for adenopathy. Does not bruise/bleed easily.  Psychiatric/Behavioral:  Negative for dysphoric mood. The patient is not nervous/anxious.     Objective:  BP 114/72   Pulse 67   Temp (!) 95.2 F (35.1 C)   Ht 5\' 11"  (1.803 m)   Wt 201 lb (91.2 kg)   SpO2 98%   BMI 28.03 kg/m   Wt Readings from Last 3 Encounters:  07/01/23 201 lb (91.2 kg)  11/08/21 251 lb 7 oz (114.1 kg)  04/01/21 230 lb (104.3 kg)      Physical Exam Vitals and nursing note reviewed.  Constitutional:      General: He is not in acute distress.    Appearance: Normal appearance. He is well-developed. He is not ill-appearing.  HENT:     Head: Normocephalic and atraumatic.     Right Ear: Hearing, tympanic membrane, ear canal and external ear normal.     Left Ear: Hearing, tympanic membrane, ear canal and external ear normal.     Nose: Nose normal.     Mouth/Throat:     Mouth: Mucous membranes are moist.     Pharynx: Oropharynx is clear. No oropharyngeal exudate or posterior oropharyngeal erythema.  Eyes:     General: No scleral icterus.    Extraocular Movements: Extraocular movements intact.     Conjunctiva/sclera: Conjunctivae normal.     Pupils: Pupils are equal, round, and reactive to light.  Neck:     Thyroid: No thyroid mass or thyromegaly.  Cardiovascular:     Rate and Rhythm: Normal rate and regular rhythm.     Pulses: Normal pulses.          Radial pulses are 2+ on the right side and 2+ on the left side.     Heart sounds: Normal heart sounds. No murmur heard. Pulmonary:     Effort: Pulmonary effort is normal. No respiratory distress.     Breath sounds: Normal breath sounds. No wheezing, rhonchi or rales.  Abdominal:     General: Bowel sounds are normal. There is no distension.     Palpations: Abdomen is soft. There is no mass.      Tenderness: There is no abdominal tenderness. There is no guarding or rebound.     Hernia: No hernia is present.  Genitourinary:    Comments: Circumferential prolapsed tissue Musculoskeletal:        General: Normal range of motion.     Cervical back: Normal range of motion and neck supple.     Right lower leg: No edema.     Left lower leg: No edema.  Lymphadenopathy:     Cervical: No cervical adenopathy.  Skin:    General: Skin is warm and dry.     Findings: Rash (faint scaling annular rash to right buttock) present.  Neurological:     General: No focal deficit  present.     Mental Status: He is alert and oriented to person, place, and time.  Psychiatric:        Mood and Affect: Mood normal.        Behavior: Behavior normal.        Thought Content: Thought content normal.        Judgment: Judgment normal.       Results for orders placed or performed in visit on 07/01/23  C. trachomatis/N. gonorrhoeae RNA   Specimen: Blood  Result Value Ref Range   C. trachomatis RNA, TMA NOT DETECTED NOT DETECTED   N. gonorrhoeae RNA, TMA NOT DETECTED NOT DETECTED  Comprehensive metabolic panel  Result Value Ref Range   Sodium 138 135 - 145 mEq/L   Potassium 4.2 3.5 - 5.1 mEq/L   Chloride 101 96 - 112 mEq/L   CO2 31 19 - 32 mEq/L   Glucose, Bld 73 70 - 99 mg/dL   BUN 18 6 - 23 mg/dL   Creatinine, Ser 4.09 0.40 - 1.50 mg/dL   Total Bilirubin 0.8 0.2 - 1.2 mg/dL   Alkaline Phosphatase 47 39 - 117 U/L   AST 13 0 - 37 U/L   ALT 17 0 - 53 U/L   Total Protein 6.8 6.0 - 8.3 g/dL   Albumin 4.5 3.5 - 5.2 g/dL   GFR 81.19 (L) >14.78 mL/min   Calcium 9.3 8.4 - 10.5 mg/dL  Lipid panel  Result Value Ref Range   Cholesterol 194 0 - 200 mg/dL   Triglycerides 29.5 0.0 - 149.0 mg/dL   HDL 62.13 >08.65 mg/dL   VLDL 78.4 0.0 - 69.6 mg/dL   LDL Cholesterol 295 (H) 0 - 99 mg/dL   Total CHOL/HDL Ratio 3    NonHDL 136.57   TSH  Result Value Ref Range   TSH 3.04 0.35 - 5.50 uIU/mL  PSA  Result  Value Ref Range   PSA 4.75 (H) 0.10 - 4.00 ng/mL  Vitamin B12  Result Value Ref Range   Vitamin B-12 560 211 - 911 pg/mL  VITAMIN D 25 Hydroxy (Vit-D Deficiency, Fractures)  Result Value Ref Range   VITD 48.58 30.00 - 100.00 ng/mL  T4, free  Result Value Ref Range   Free T4 0.99 0.60 - 1.60 ng/dL  T3  Result Value Ref Range   T3, Total 94 76 - 181 ng/dL  HIV Antibody (routine testing w rflx)  Result Value Ref Range   HIV 1&2 Ab, 4th Generation NON-REACTIVE NON-REACTIVE  RPR  Result Value Ref Range   RPR Ser Ql NON-REACTIVE NON-REACTIVE   Lab Results  Component Value Date   WBC 5.8 03/13/2021   HGB 15.5 03/13/2021   HCT 45.5 03/13/2021   MCV 86.8 03/13/2021   PLT 207.0 03/13/2021    Assessment & Plan:   Problem List Items Addressed This Visit     Encounter for general adult medical examination with abnormal findings - Primary (Chronic)    Preventative protocols reviewed and updated unless pt declined. Discussed healthy diet and lifestyle.  New sexual partners - update STD screen      Relevant Orders   Comprehensive metabolic panel (Completed)   Lipid panel (Completed)   GERD (gastroesophageal reflux disease)    This improved with weight loss, now off PPI      Hemorrhoids    Longstanding issue with prolapsed internal hemorrhoids, s/p latest colonoscopy 2022.  Today with evidence of circumferential prolapse, ?component of rectal prolapse.  Will refer to  colorectal surgeon for evaluation - he request for referral to [facility] where he lives - referral placed.       Relevant Medications   tadalafil (CIALIS) 5 MG tablet   Other Relevant Orders   Ambulatory referral to General Surgery   Overweight with body mass index (BMI) 25.0-29.9    Congratulated on intentional 50 lb weight loss since last visit which he attributes to healthy diet choices and increased exercise routine.       Fatigue    Significant improvement with testosterone replacement and weight loss.        Relevant Orders   Vitamin B12 (Completed)   VITAMIN D 25 Hydroxy (Vit-D Deficiency, Fractures) (Completed)   Acquired hypothyroidism    Update labs off replacement.       Relevant Orders   TSH (Completed)   T4, free (Completed)   T3 (Completed)   Erectile dysfunction    Finds daily low dose cialis is more effective than PRN viagra - will refill this.       Tinea corporis    Chronic issue, has seen derm for this, treated with antifungal.       Vitamin D deficiency    Update levels off replacement.       Relevant Orders   VITAMIN D 25 Hydroxy (Vit-D Deficiency, Fractures) (Completed)   Other Visit Diagnoses     Special screening for malignant neoplasm of prostate       Relevant Orders   PSA (Completed)   Screen for STD (sexually transmitted disease)       Relevant Orders   HIV Antibody (routine testing w rflx) (Completed)   RPR (Completed)   C. trachomatis/N. gonorrhoeae RNA (Completed)        Meds ordered this encounter  Medications   tadalafil (CIALIS) 5 MG tablet    Sig: Take 1 tablet (5 mg total) by mouth daily.    Dispense:  90 tablet    Refill:  3    Orders Placed This Encounter  Procedures   C. trachomatis/N. gonorrhoeae RNA   Comprehensive metabolic panel   Lipid panel   TSH   PSA   Vitamin B12   VITAMIN D 25 Hydroxy (Vit-D Deficiency, Fractures)   T4, free   T3   HIV Antibody (routine testing w rflx)   RPR   Ambulatory referral to General Surgery    Referral Priority:   Routine    Referral Type:   Surgical    Referral Reason:   Specialty Services Required    Requested Specialty:   General Surgery    Number of Visits Requested:   1    Patient Instructions  Labs today  Cialis 5mg  daily refilled.  We will refer you to general surgery in [facility] for evaluation.  Good to see you today.  Return in 1 year for next physical.   Follow up plan: Return in about 1 year (around 06/30/2024) for annual exam, prior fasting for blood  work.  Eustaquio Boyden, MD

## 2023-07-01 NOTE — Patient Instructions (Addendum)
Labs today  Cialis 5mg  daily refilled.  We will refer you to general surgery in [facility] for evaluation.  Good to see you today.  Return in 1 year for next physical.

## 2023-07-02 ENCOUNTER — Encounter: Payer: Self-pay | Admitting: Family Medicine

## 2023-07-02 DIAGNOSIS — R972 Elevated prostate specific antigen [PSA]: Secondary | ICD-10-CM | POA: Insufficient documentation

## 2023-07-02 LAB — T3: T3, Total: 94 ng/dL (ref 76–181)

## 2023-07-02 LAB — RPR: RPR Ser Ql: NONREACTIVE

## 2023-07-02 LAB — HIV ANTIBODY (ROUTINE TESTING W REFLEX): HIV 1&2 Ab, 4th Generation: NONREACTIVE

## 2023-07-02 LAB — C. TRACHOMATIS/N. GONORRHOEAE RNA
C. trachomatis RNA, TMA: NOT DETECTED
N. gonorrhoeae RNA, TMA: NOT DETECTED

## 2023-07-02 NOTE — Assessment & Plan Note (Signed)
Update labs off replacement.

## 2023-07-02 NOTE — Assessment & Plan Note (Signed)
Significant improvement with testosterone replacement and weight loss.

## 2023-07-02 NOTE — Assessment & Plan Note (Signed)
Congratulated on intentional 50 lb weight loss since last visit which he attributes to healthy diet choices and increased exercise routine.

## 2023-07-02 NOTE — Assessment & Plan Note (Addendum)
Longstanding issue with prolapsed internal hemorrhoids, s/p latest colonoscopy 2022.  Today with evidence of circumferential prolapse, ?component of rectal prolapse.  Will refer to colorectal surgeon for evaluation - he request for referral to [facility] where he lives - referral placed.

## 2023-07-02 NOTE — Assessment & Plan Note (Signed)
Chronic issue, has seen derm for this, treated with antifungal.

## 2023-07-02 NOTE — Assessment & Plan Note (Signed)
Update levels off replacement.

## 2023-07-02 NOTE — Assessment & Plan Note (Signed)
Finds daily low dose cialis is more effective than PRN viagra - will refill this.

## 2023-07-02 NOTE — Assessment & Plan Note (Addendum)
This improved with weight loss, now off PPI

## 2023-07-03 ENCOUNTER — Encounter: Payer: Self-pay | Admitting: *Deleted

## 2023-08-27 ENCOUNTER — Ambulatory Visit (INDEPENDENT_AMBULATORY_CARE_PROVIDER_SITE_OTHER): Payer: Self-pay | Admitting: Dermatology

## 2023-08-27 ENCOUNTER — Encounter: Payer: Self-pay | Admitting: Dermatology

## 2023-08-27 DIAGNOSIS — B354 Tinea corporis: Secondary | ICD-10-CM

## 2023-08-27 DIAGNOSIS — B360 Pityriasis versicolor: Secondary | ICD-10-CM

## 2023-08-27 MED ORDER — FLUCONAZOLE 200 MG PO TABS: 200.0000 mg | ORAL_TABLET | Freq: Every day | ORAL | 1 refills | Status: DC

## 2023-08-27 MED ORDER — KETOCONAZOLE 2 % EX SHAM: MEDICATED_SHAMPOO | CUTANEOUS | 11 refills | Status: DC

## 2023-08-27 MED ORDER — KETOCONAZOLE 2 % EX CREA: 1.0000 | TOPICAL_CREAM | Freq: Every day | CUTANEOUS | 11 refills | Status: DC

## 2023-08-27 NOTE — Progress Notes (Signed)
Follow-Up Visit   Subjective  Eric Colon is a 57 y.o. male who presents for the following: white patches on arms and back. Dr. Gwen Pounds prescribed a shampoo and it helped.   Recheck tinea corporis at buttocks. Treated in the past with Fluconazole 200 mg and Ketoconazole cream. Fluconazole was helpful and patient had no side effects. States cream does not work well. Has used OTC Selsun Blue shampoo as well. Dur: over 10 years, comes and goes.  The patient has spots, moles and lesions to be evaluated, some may be new or changing and the patient may have concern these could be cancer.   The following portions of the chart were reviewed this encounter and updated as appropriate: medications, allergies, medical history  Review of Systems:  No other skin or systemic complaints except as noted in HPI or Assessment and Plan.  Objective  Well appearing patient in no apparent distress; mood and affect are within normal limits.  A focused examination was performed of the following areas: Arms, back, buttocks  Relevant exam findings are noted in the Assessment and Plan.    Assessment & Plan    Tinea Versicolor  Exam: innumerable hypopigmented scaly macules on upper trunk and upper arms and shoulders Chronic recurrent and persistent condition with duration or expected duration over one year. Condition is bothersome/symptomatic for patient. Currently flared.   Treatment: Ketoconazole shampoo: Apply to affected area on back and shoulders twice per week. Leave on for 5 minutes before washing off   Tinea versicolor is a chronic recurrent skin rash causing discolored scaly spots most commonly seen on back, chest, and/or shoulders.  It is generally asymptomatic. The rash is due to overgrowth of a common type of yeast present on everyone's skin and it is not contagious.  It tends to flare more in the summer due to increased sweating on trunk.  After rash is treated, the scaliness will  resolve, but the discoloration will take longer to return to normal pigmentation. The periodic use of an OTC medicated soap/shampoo with zinc or selenium sulfide can be helpful to prevent yeast overgrowth and recurrence.   Tinea versicolor  Related Medications ketoconazole (NIZORAL) 2 % shampoo Apply to affected area on back and shoulders twice per week. Leave on for 5 minutes before washing off  ketoconazole (NIZORAL) 2 % cream Apply 1 Application topically daily. Apply to affected areas daily  Tinea corporis  Related Medications ketoconazole (NIZORAL) 2 % cream Apply to skin daily  fluconazole (DIFLUCAN) 200 MG tablet Take 1 tablet (200 mg total) by mouth daily.  ketoconazole (NIZORAL) 2 % cream Apply 1 Application topically daily. Apply to affected areas daily  Tinea Corporis  Chronic and persistent condition with duration or expected duration over one year. Condition is bothersome/symptomatic for patient. Currently flared.  Exam: pink scaly patches  Reviewed labs from 07/01/2023. LFT WNL. Discussed low GFR with patient. Recommend follow up with PCP. Patient denies HTN and diabetes.   Treatment:  Take Fluconazole 200 mg once daily x 30 days. Get refill if tolerating and not resolved Reduce dose to every other day if GFR falls below 50 Sent [place]: TERBINAFINE 1.7%/ITRACON 1%/KETOCON 1%/CLOTREM 1%/UNDECLYENIC cream compound apply once daily to affected areas on buttocks.  Follow up with PCP regarding decreased renal function x at least 6 years  Side effects of fluconazole (diflucan) include nausea, diarrhea, headache, dizziness, taste changes, rare risk of irritation of the liver, allergy, or decreased blood counts (which could show up as  infection or tiredness).    Return in about 2 months (around 10/27/2023) for Rash Follow Up.  I, Lawson Radar, CMA, am acting as scribe for Elie Goody, MD.   Documentation: I have reviewed the above documentation for  accuracy and completeness, and I agree with the above.  Elie Goody, MD

## 2023-08-27 NOTE — Patient Instructions (Addendum)
Ketoconazole shampoo: Apply to affected area on back and shoulders twice per week. Leave on for 5 minutes before washing off   Take Fluconazole 200 mg once daily.   TERBINAFINE 1.7%/ITRACON 1%/KETOCON 1%/CLOTREM 1%/UNDECLYENIC cream compound apply once daily to affected areas on buttocks.    Side effects of fluconazole (diflucan) include nausea, diarrhea, headache, dizziness, taste changes, rare risk of irritation of the liver, allergy, or decreased blood counts (which could show up as infection or tiredness).    Due to recent changes in healthcare laws, you may see results of your pathology and/or laboratory studies on MyChart before the doctors have had a chance to review them. We understand that in some cases there may be results that are confusing or concerning to you. Please understand that not all results are received at the same time and often the doctors may need to interpret multiple results in order to provide you with the best plan of care or course of treatment. Therefore, we ask that you please give [place] 2 business days to thoroughly review all your results before contacting the office for clarification. Should we see a critical lab result, you will be contacted sooner.   If You Need Anything After Your Visit  If you have any questions or concerns for your doctor, please call our main line at [phone] and press option 4 to reach your doctor's medical assistant. If no one answers, please leave a voicemail as directed and we will return your call as soon as possible. Messages left after 4 pm will be answered the following business day.   You may also send [place] a message via MyChart. We typically respond to MyChart messages within 1-2 business days.  For prescription refills, please ask your pharmacy to contact our office. Our fax number is [phone].  If you have an urgent issue when the clinic is closed that cannot wait until the next business day, you can page your doctor at  the number below.    Please note that while we do our best to be available for urgent issues outside of office hours, we are not available 24/7.   If you have an urgent issue and are unable to reach [place], you may choose to seek medical care at your doctor's office, retail clinic, urgent care center, or emergency room.  If you have a medical emergency, please immediately call 911 or go to the emergency department.  Pager Numbers  - Dr. Gwen Pounds: [phone]  - Dr. Roseanne Reno: [phone]  - Dr. Katrinka Blazing: [phone]   In the event of inclement weather, please call our main line at [phone] for an update on the status of any delays or closures.  Dermatology Medication Tips: Please keep the boxes that topical medications come in in order to help keep track of the instructions about where and how to use these. Pharmacies typically print the medication instructions only on the boxes and not directly on the medication tubes.   If your medication is too expensive, please contact our office at [phone] option 4 or send [place] a message through MyChart.   We are unable to tell what your co-pay for medications will be in advance as this is different depending on your insurance coverage. However, we may be able to find a substitute medication at lower cost or fill out paperwork to get insurance to cover a needed medication.   If a prior authorization is required to get your medication covered by your insurance company, please allow [place] 1-2 business  days to complete this process.  Drug prices often vary depending on where the prescription is filled and some pharmacies may offer cheaper prices.  The website www.goodrx.com contains coupons for medications through different pharmacies. The prices here do not account for what the cost may be with help from insurance (it may be cheaper with your insurance), but the website can give you the price if you did not use any insurance.  - You can print the  associated coupon and take it with your prescription to the pharmacy.  - You may also stop by our office during regular business hours and pick up a GoodRx coupon card.  - If you need your prescription sent electronically to a different pharmacy, notify our office through [facility] or by phone at [phone] option 4.     Si Usted Necesita Algo Despus de Su Visita  Tambin puede enviarnos un mensaje a travs de Clinical cytogeneticist. Por lo general respondemos a los mensajes de MyChart en el transcurso de 1 a 2 das hbiles.  Para renovar recetas, por favor pida a su farmacia que se ponga en contacto con nuestra oficina. Annie Sable de fax es [place] [phone].  Si tiene un asunto urgente cuando la clnica est cerrada y que no puede esperar hasta el siguiente da hbil, puede llamar/localizar a su doctor(a) al nmero que aparece a continuacin.   Por favor, tenga en cuenta que aunque hacemos todo lo posible para estar disponibles para asuntos urgentes fuera del horario de [place], no estamos disponibles las 24 horas del da, los 7 [address]  [address] de la [place].   Si tiene un problema urgente y no puede comunicarse con nosotros, puede optar por buscar atencin mdica  en el consultorio de su doctor(a), en una clnica privada, en un centro de atencin urgente o en una sala de emergencias.  Si tiene Engineer, drilling, por favor llame inmediatamente al 911 o vaya a la sala de emergencias.  Nmeros de bper  - Dr. Gwen Pounds: [phone]  - Dra. Roseanne Reno: [phone]  - Dr. Katrinka Blazing: [phone]   En caso de inclemencias del tiempo, por favor llame a Lacy Duverney principal al [phone] para una actualizacin sobre el [place] de cualquier retraso o cierre.  Consejos para la medicacin en dermatologa: Por favor, guarde las cajas en las que vienen los medicamentos de uso tpico para ayudarle a seguir las instrucciones sobre dnde y cmo usarlos. Las farmacias generalmente imprimen las instrucciones  del medicamento slo en las cajas y no directamente en los tubos del [place].   Si su medicamento es muy caro, por favor, pngase en contacto con Rolm Gala llamando al [phone] y presione la opcin 4 o envenos un mensaje a travs de Clinical cytogeneticist.   No podemos decirle cul ser su copago por los medicamentos por adelantado ya que esto es diferente dependiendo de la cobertura de su seguro. Sin embargo, es posible que podamos encontrar un medicamento sustituto a Audiological scientist un formulario para que el seguro cubra el medicamento que se considera necesario.   Si se requiere una autorizacin previa para que su compaa de seguros [place] su medicamento, por favor permtanos de 1 a 2 das hbiles para completar [address]  Los precios de los medicamentos varan con frecuencia dependiendo del Environmental consultant de dnde se surte la receta y alguna farmacias pueden ofrecer precios ms baratos.  El sitio web www.goodrx.com tiene cupones para medicamentos de Health and safety inspector. Los precios aqu no tienen en cuenta lo que podra costar con  la ayuda del seguro (puede ser ms barato con su seguro), pero el sitio web puede darle el precio si no Visual merchandiser.  - Puede imprimir el cupn correspondiente y llevarlo con su receta a la farmacia.  - Tambin puede pasar por nuestra oficina durante el horario de atencin regular y Education officer, museum una tarjeta de cupones de GoodRx.  - Si necesita que su receta se enve electrnicamente a una farmacia diferente, informe a nuestra oficina a travs de MyChart de [place] o por telfono llamando al [phone] y presione la opcin 4.

## 2023-11-12 ENCOUNTER — Ambulatory Visit: Payer: Self-pay | Admitting: Dermatology

## 2024-04-01 ENCOUNTER — Ambulatory Visit: Payer: Self-pay

## 2024-04-01 NOTE — Telephone Encounter (Signed)
 FYI Only or Action Required?: Action required by provider: request for appointment. Currently in [facility], refuses UC there. Wants appointment Monday. Appointment made.If symptoms worsen , will go to UC.  Patient was last seen in primary care on 07/01/2023 by Claire Crick, MD. Called Nurse Triage reporting Urinary symptoms. Symptoms began 1 week ago, frequency,flank swelling.a week ago. Interventions attempted: Rest, hydration, or home remedies. Symptoms are: unchanged.  Triage Disposition: See Physician Within 24 Hours  Patient/caregiver understands and will follow disposition?:         Copied from CRM [id number]. Topic: Clinical - Red Word Triage >> Apr 01, 2024 11:21 AM Eric Colon wrote: Red Word that prompted transfer to Nurse Triage: patient states lower gut area hurts and is swollen, and urinating a lot Reason for Disposition  Urinating more frequently than usual (i.e., frequency)  Answer Assessment - Initial Assessment Questions 1. SYMPTOM: What's the main symptom you're concerned about? (e.g., frequency, incontinence)     Urinary frequency 2. ONSET: When did the    start?     1 week 3. PAIN: Is there any pain? If Yes, ask: How bad is it? (Scale: 1-10; mild, moderate, severe)     no 4. CAUSE: What do you think is causing the symptoms?     Flank swelling 5. OTHER SYMPTOMS: Do you have any other symptoms? (e.g., blood in urine, fever, flank pain, pain with urination)     No 6. PREGNANCY: Is there any chance you are pregnant? When was your last menstrual period?     N/a  Protocols used: Urinary Symptoms-A-AH

## 2024-04-01 NOTE — Telephone Encounter (Signed)
 Noted--will check at visit on Monday

## 2024-04-01 NOTE — Telephone Encounter (Signed)
 Noted

## 2024-04-04 ENCOUNTER — Encounter: Payer: Self-pay | Admitting: Internal Medicine

## 2024-04-04 ENCOUNTER — Ambulatory Visit: Payer: Self-pay | Admitting: Internal Medicine

## 2024-04-04 ENCOUNTER — Ambulatory Visit (INDEPENDENT_AMBULATORY_CARE_PROVIDER_SITE_OTHER): Payer: Self-pay | Admitting: Internal Medicine

## 2024-04-04 VITALS — BP 130/74 | HR 66 | Temp 98.3°F | Ht 71.0 in | Wt 221.0 lb

## 2024-04-04 DIAGNOSIS — K623 Rectal prolapse: Secondary | ICD-10-CM | POA: Diagnosis not present

## 2024-04-04 DIAGNOSIS — R972 Elevated prostate specific antigen [PSA]: Secondary | ICD-10-CM | POA: Diagnosis not present

## 2024-04-04 DIAGNOSIS — R350 Frequency of micturition: Secondary | ICD-10-CM | POA: Diagnosis not present

## 2024-04-04 LAB — POC URINALSYSI DIPSTICK (AUTOMATED)
Bilirubin, UA: NEGATIVE
Blood, UA: NEGATIVE
Glucose, UA: NEGATIVE
Ketones, UA: NEGATIVE
Leukocytes, UA: NEGATIVE
Nitrite, UA: NEGATIVE
Protein, UA: NEGATIVE
Spec Grav, UA: 1.01 (ref 1.010–1.025)
Urobilinogen, UA: 0.2 U/dL
pH, UA: 6 (ref 5.0–8.0)

## 2024-04-04 MED ORDER — TADALAFIL 5 MG PO TABS: 5.0000 mg | ORAL_TABLET | Freq: Every day | ORAL | 1 refills | Status: AC

## 2024-04-04 MED ORDER — DOXYCYCLINE HYCLATE 100 MG PO TABS: 100.0000 mg | ORAL_TABLET | Freq: Two times a day (BID) | ORAL | 0 refills | Status: AC

## 2024-04-04 NOTE — Assessment & Plan Note (Addendum)
 Will recheck today since never saw urology (who he may need to see urologist anyway) Could have false alarm if prostatitis--but would indicate need for follow up

## 2024-04-04 NOTE — Progress Notes (Signed)
 Subjective:    Patient ID: Eric Colon, male    DOB: Apr 21, 1966, 58 y.o.   MRN: [id number]  HPI Here due to urinary issues  Reviewed his physical from September 2 concerns were mildly low GFR and elevated PSA  Now in past few days--feels he is gaining weight without explanation Having urinary frequency--despite no increased fluids Marked urgency Started about 10 days ago Did recently start [employer] probiotic  No dysuria No visible blood Stream has been getting weaker No fever  Uses cialis --daily Not always effective Monogamous in the past month---divorced 2 years ago (so several partners prior) Condoms every time  Now lives permanently in [place]  Current Outpatient Medications on File Prior to Visit  Medication Sig Dispense Refill   tadalafil  (CIALIS ) 5 MG tablet Take 1 tablet (5 mg total) by mouth daily. 90 tablet 3   No current facility-administered medications on file prior to visit.    No Known Allergies  Past Medical History:  Diagnosis Date   COVID-19 virus infection 07/10/2020   GERD (gastroesophageal reflux disease)    Hemorrhoids    Migraines     Past Surgical History:  Procedure Laterality Date   COLONOSCOPY WITH PROPOFOL  N/A 04/01/2021   TAs, rpt 20yrs Renne, Darren, MD)   INGUINAL HERNIA REPAIR Right 10/13/1988   POLYPECTOMY N/A 04/01/2021   Procedure: POLYPECTOMY;  Surgeon: Jinny Carmine, MD;  Location: [facility];  Service: Endoscopy;  Laterality: N/A;    Family History  Problem Relation Age of Onset   Cancer Mother 81       lung (smoker)   Cancer Father 54       prostate?   Diabetes Maternal Grandmother    CAD Neg Hx    Stroke Neg Hx     Social History   Socioeconomic History   Marital status: Married    Spouse name: Not on file   Number of children: Not on file   Years of education: Not on file   Highest education level: Not on file  Occupational History   Not on file  Tobacco Use   Smoking status: Never    Smokeless tobacco: Never  Vaping Use   Vaping status: Never Used  Substance and Sexual Activity   Alcohol use: Yes    Alcohol/week: 0.0 standard drinks of alcohol    Comment: 2 beers/week   Drug use: No   Sexual activity: Not on file  Other Topics Concern   Not on file  Social History Narrative   Lives with wife, grown children   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  Pulmonary:     Effort: Pulmonary effort is normal.     Breath sounds: Normal breath sounds. No wheezing or rales.  Abdominal:     Palpations: Abdomen is soft.     Tenderness: There is no abdominal tenderness. There is no guarding or  rebound.  Genitourinary:    Comments: Normal testes and scrotum Mild rectal prolapse Normal prostate--mild tenderness  Neurological:     Mental Status: He is alert.            Assessment & Plan:

## 2024-04-04 NOTE — Assessment & Plan Note (Signed)
 Mild Probably needs surgical evaluation Might want to try pelvic muscle therapy Discussed avoiding constipation

## 2024-04-04 NOTE — Assessment & Plan Note (Signed)
 Urinalysis is totally benign---no glucose and normal SG---so neither diabetes No urinary infection--but could have low level prostatitis (since only brief symptoms don't suggest progressive BPH) Will try doxycycline 100 bid x 10 days (with refill) May need to consider adding tamsulosin

## 2024-04-04 NOTE — Addendum Note (Signed)
 Addended by: KALLIE CLOTILDA SQUIBB on: 04/04/2024 02:31 PM   Modules accepted: Orders

## 2024-04-05 LAB — RENAL FUNCTION PANEL
Albumin: 4.4 g/dL (ref 3.5–5.2)
BUN: 15 mg/dL (ref 6–23)
CO2: 31 meq/L (ref 19–32)
Calcium: 9.1 mg/dL (ref 8.4–10.5)
Chloride: 99 meq/L (ref 96–112)
Creatinine, Ser: 1.54 mg/dL — ABNORMAL HIGH (ref 0.40–1.50)
GFR: 50.1 mL/min — ABNORMAL LOW (ref >60.00)
Glucose, Bld: 82 mg/dL (ref 70–99)
Phosphorus: 3.1 mg/dL (ref 2.3–4.6)
Potassium: 4.4 meq/L (ref 3.5–5.1)
Sodium: 136 meq/L (ref 135–145)

## 2024-04-05 LAB — PSA: PSA: 6.63 ng/mL — ABNORMAL HIGH (ref 0.10–4.00)
